# Patient Record
Sex: Male | Born: 1945 | Race: Black or African American | Hispanic: No | Marital: Married | Smoking: Former smoker
Health system: Southern US, Community
[De-identification: ages and names within clinical notes are randomized; demographics above are authoritative.]

## PROBLEM LIST (undated history)

## (undated) DIAGNOSIS — C61 Malignant neoplasm of prostate: Secondary | ICD-10-CM

## (undated) DIAGNOSIS — I10 Essential (primary) hypertension: Secondary | ICD-10-CM

## (undated) DIAGNOSIS — K219 Gastro-esophageal reflux disease without esophagitis: Secondary | ICD-10-CM

## (undated) DIAGNOSIS — E119 Type 2 diabetes mellitus without complications: Secondary | ICD-10-CM

## (undated) DIAGNOSIS — IMO0001 Reserved for inherently not codable concepts without codable children: Secondary | ICD-10-CM

## (undated) DIAGNOSIS — M1990 Unspecified osteoarthritis, unspecified site: Secondary | ICD-10-CM

## (undated) HISTORY — PX: TRANSURETHRAL RESECTION OF PROSTATE: SHX73

---

## 1998-04-13 ENCOUNTER — Encounter: Payer: Self-pay | Admitting: Internal Medicine

## 1998-04-13 ENCOUNTER — Ambulatory Visit (HOSPITAL_COMMUNITY): Admission: RE | Admit: 1998-04-13 | Discharge: 1998-04-13 | Payer: Self-pay | Admitting: Internal Medicine

## 1998-10-01 ENCOUNTER — Encounter: Payer: Self-pay | Admitting: Internal Medicine

## 1998-10-01 ENCOUNTER — Inpatient Hospital Stay (HOSPITAL_COMMUNITY): Admission: EM | Admit: 1998-10-01 | Discharge: 1998-10-02 | Payer: Self-pay | Admitting: *Deleted

## 2001-05-09 ENCOUNTER — Emergency Department (HOSPITAL_COMMUNITY): Admission: EM | Admit: 2001-05-09 | Discharge: 2001-05-09 | Payer: Self-pay | Admitting: Emergency Medicine

## 2001-05-09 ENCOUNTER — Encounter: Payer: Self-pay | Admitting: Emergency Medicine

## 2001-05-11 ENCOUNTER — Encounter: Admission: RE | Admit: 2001-05-11 | Discharge: 2001-05-11 | Payer: Self-pay | Admitting: Internal Medicine

## 2001-05-11 ENCOUNTER — Encounter: Payer: Self-pay | Admitting: Internal Medicine

## 2001-11-10 ENCOUNTER — Encounter: Admission: RE | Admit: 2001-11-10 | Discharge: 2001-11-10 | Payer: Self-pay | Admitting: Internal Medicine

## 2001-11-10 ENCOUNTER — Encounter: Payer: Self-pay | Admitting: Internal Medicine

## 2003-11-07 ENCOUNTER — Ambulatory Visit (HOSPITAL_COMMUNITY): Admission: RE | Admit: 2003-11-07 | Discharge: 2003-11-07 | Payer: Self-pay | Admitting: Urology

## 2003-12-13 ENCOUNTER — Inpatient Hospital Stay (HOSPITAL_COMMUNITY): Admission: RE | Admit: 2003-12-13 | Discharge: 2003-12-16 | Payer: Self-pay | Admitting: Urology

## 2004-02-27 ENCOUNTER — Ambulatory Visit: Admission: RE | Admit: 2004-02-27 | Discharge: 2004-05-26 | Payer: Self-pay | Admitting: Radiation Oncology

## 2004-03-06 ENCOUNTER — Encounter (HOSPITAL_COMMUNITY): Admission: RE | Admit: 2004-03-06 | Discharge: 2004-06-04 | Payer: Self-pay | Admitting: Radiation Oncology

## 2004-06-13 ENCOUNTER — Ambulatory Visit: Admission: RE | Admit: 2004-06-13 | Discharge: 2004-06-13 | Payer: Self-pay | Admitting: Radiation Oncology

## 2004-07-25 ENCOUNTER — Ambulatory Visit: Admission: RE | Admit: 2004-07-25 | Discharge: 2004-07-25 | Payer: Self-pay | Admitting: Radiation Oncology

## 2004-09-05 ENCOUNTER — Emergency Department (HOSPITAL_COMMUNITY): Admission: EM | Admit: 2004-09-05 | Discharge: 2004-09-05 | Payer: Self-pay | Admitting: Emergency Medicine

## 2004-09-25 ENCOUNTER — Ambulatory Visit: Payer: Self-pay | Admitting: Internal Medicine

## 2004-09-26 ENCOUNTER — Ambulatory Visit: Payer: Self-pay | Admitting: Internal Medicine

## 2010-07-24 ENCOUNTER — Encounter: Payer: Self-pay | Admitting: Internal Medicine

## 2013-08-22 ENCOUNTER — Emergency Department (HOSPITAL_COMMUNITY)
Admission: EM | Admit: 2013-08-22 | Discharge: 2013-08-22 | Disposition: A | Discharge status: Home / Self Care | Payer: Non-veteran care | Attending: Emergency Medicine | Admitting: Emergency Medicine

## 2013-08-22 ENCOUNTER — Emergency Department (INDEPENDENT_AMBULATORY_CARE_PROVIDER_SITE_OTHER)
Admission: EM | Admit: 2013-08-22 | Discharge: 2013-08-22 | Disposition: A | Discharge status: Hospice | Payer: Medicare Other | Source: Home / Self Care | Attending: Family Medicine | Admitting: Family Medicine

## 2013-08-22 ENCOUNTER — Encounter (HOSPITAL_COMMUNITY): Payer: Self-pay | Admitting: Emergency Medicine

## 2013-08-22 DIAGNOSIS — E119 Type 2 diabetes mellitus without complications: Secondary | ICD-10-CM

## 2013-08-22 DIAGNOSIS — I10 Essential (primary) hypertension: Secondary | ICD-10-CM | POA: Insufficient documentation

## 2013-08-22 DIAGNOSIS — Z8546 Personal history of malignant neoplasm of prostate: Secondary | ICD-10-CM | POA: Insufficient documentation

## 2013-08-22 DIAGNOSIS — R350 Frequency of micturition: Secondary | ICD-10-CM | POA: Insufficient documentation

## 2013-08-22 DIAGNOSIS — Z79899 Other long term (current) drug therapy: Secondary | ICD-10-CM | POA: Insufficient documentation

## 2013-08-22 DIAGNOSIS — E1165 Type 2 diabetes mellitus with hyperglycemia: Secondary | ICD-10-CM

## 2013-08-22 DIAGNOSIS — Z87891 Personal history of nicotine dependence: Secondary | ICD-10-CM | POA: Insufficient documentation

## 2013-08-22 DIAGNOSIS — I160 Hypertensive urgency: Secondary | ICD-10-CM

## 2013-08-22 DIAGNOSIS — K219 Gastro-esophageal reflux disease without esophagitis: Secondary | ICD-10-CM | POA: Insufficient documentation

## 2013-08-22 HISTORY — DX: Malignant neoplasm of prostate: C61

## 2013-08-22 HISTORY — DX: Essential (primary) hypertension: I10

## 2013-08-22 HISTORY — DX: Reserved for inherently not codable concepts without codable children: IMO0001

## 2013-08-22 HISTORY — DX: Gastro-esophageal reflux disease without esophagitis: K21.9

## 2013-08-22 LAB — COMPREHENSIVE METABOLIC PANEL
ALK PHOS: 158 U/L — AB (ref 39–117)
ALT: 41 U/L (ref 0–53)
ANION GAP: 17 — AB (ref 5–15)
AST: 33 U/L (ref 0–37)
Albumin: 4.4 g/dL (ref 3.5–5.2)
BUN: 22 mg/dL (ref 6–23)
CO2: 25 meq/L (ref 19–32)
Calcium: 9.7 mg/dL (ref 8.4–10.5)
Chloride: 92 mEq/L — ABNORMAL LOW (ref 96–112)
Creatinine, Ser: 1.14 mg/dL (ref 0.50–1.35)
GFR calc Af Amer: 76 mL/min — ABNORMAL LOW (ref >90)
GFR calc non Af Amer: 66 mL/min — ABNORMAL LOW (ref >90)
Glucose, Bld: 515 mg/dL — ABNORMAL HIGH (ref 70–99)
POTASSIUM: 4.4 meq/L (ref 3.7–5.3)
SODIUM: 134 meq/L — AB (ref 137–147)
Total Bilirubin: 0.7 mg/dL (ref 0.3–1.2)
Total Protein: 9 g/dL — ABNORMAL HIGH (ref 6.0–8.3)

## 2013-08-22 LAB — CBC WITH DIFFERENTIAL/PLATELET
BASOS ABS: 0 10*3/uL (ref 0.0–0.1)
BASOS PCT: 0 % (ref 0–1)
EOS ABS: 0.1 10*3/uL (ref 0.0–0.7)
EOS PCT: 1 % (ref 0–5)
HEMATOCRIT: 51.8 % (ref 39.0–52.0)
HEMOGLOBIN: 17.9 g/dL — AB (ref 13.0–17.0)
LYMPHS PCT: 33 % (ref 12–46)
Lymphs Abs: 2.6 10*3/uL (ref 0.7–4.0)
MCH: 27.3 pg (ref 26.0–34.0)
MCHC: 34.6 g/dL (ref 30.0–36.0)
MCV: 79.1 fL (ref 78.0–100.0)
MONO ABS: 0.5 10*3/uL (ref 0.1–1.0)
Monocytes Relative: 6 % (ref 3–12)
NEUTROS ABS: 4.7 10*3/uL (ref 1.7–7.7)
NEUTROS PCT: 60 % (ref 43–77)
Platelets: 222 10*3/uL (ref 150–400)
RBC: 6.55 MIL/uL — ABNORMAL HIGH (ref 4.22–5.81)
RDW: 13.8 % (ref 11.5–15.5)
WBC: 7.8 10*3/uL (ref 4.0–10.5)

## 2013-08-22 LAB — HEMOGLOBIN A1C
HEMOGLOBIN A1C: 11.2 % — AB (ref <5.7)
Mean Plasma Glucose: 275 mg/dL — ABNORMAL HIGH (ref <117)

## 2013-08-22 LAB — POCT I-STAT, CHEM 8
BUN: 24 mg/dL — ABNORMAL HIGH (ref 6–23)
CHLORIDE: 97 meq/L (ref 96–112)
Calcium, Ion: 1.15 mmol/L (ref 1.13–1.30)
Creatinine, Ser: 1.3 mg/dL (ref 0.50–1.35)
GLUCOSE: 602 mg/dL — AB (ref 70–99)
HEMATOCRIT: 58 % — AB (ref 39.0–52.0)
HEMOGLOBIN: 19.7 g/dL — AB (ref 13.0–17.0)
POTASSIUM: 4.3 meq/L (ref 3.7–5.3)
Sodium: 135 mEq/L — ABNORMAL LOW (ref 137–147)
TCO2: 27 mmol/L (ref 0–100)

## 2013-08-22 LAB — POCT URINALYSIS DIP (DEVICE)
BILIRUBIN URINE: NEGATIVE
Glucose, UA: >=1000 mg/dL — AB
KETONES UR: 15 mg/dL — AB
Leukocytes, UA: NEGATIVE
NITRITE: NEGATIVE
PROTEIN: NEGATIVE mg/dL
Specific Gravity, Urine: 1.01 (ref 1.005–1.030)
Urobilinogen, UA: 0.2 mg/dL (ref 0.0–1.0)
pH: 6 (ref 5.0–8.0)

## 2013-08-22 LAB — CBG MONITORING, ED
GLUCOSE-CAPILLARY: 361 mg/dL — AB (ref 70–99)
Glucose-Capillary: 318 mg/dL — ABNORMAL HIGH (ref 70–99)

## 2013-08-22 MED ORDER — SODIUM CHLORIDE 0.9 % IV BOLUS (SEPSIS)
2000.0000 mL | Freq: Once | INTRAVENOUS | Status: AC
  Administered 2013-08-22: 2000 mL via INTRAVENOUS

## 2013-08-22 MED ORDER — INSULIN ASPART 100 UNIT/ML ~~LOC~~ SOLN
10.0000 [IU] | Freq: Once | SUBCUTANEOUS | Status: AC
  Administered 2013-08-22: 10 [IU] via SUBCUTANEOUS
  Filled 2013-08-22: qty 1

## 2013-08-22 MED ORDER — INSULIN ASPART 100 UNIT/ML ~~LOC~~ SOLN
10.0000 [IU] | Freq: Once | SUBCUTANEOUS | Status: AC
  Administered 2013-08-22: 10 [IU] via INTRAVENOUS
  Filled 2013-08-22: qty 1

## 2013-08-22 MED ORDER — METFORMIN HCL 500 MG PO TABS: 500.0000 mg | ORAL_TABLET | Freq: Two times a day (BID) | ORAL | Status: DC

## 2013-08-22 NOTE — ED Notes (Signed)
Pt. Was sent to [place] from [facility] for evaluation for hyperglycemia. Wife stated, he's had urinary frequency, dry mouth, constantly thirsty, and weakness for about a week.  [facility] statedm CBG was 600

## 2013-08-22 NOTE — ED Provider Notes (Signed)
CSN: [id number]     Arrival date & time 08/22/13  1049 History   First MD Initiated Contact with Patient 08/22/13 1105     Chief Complaint  Patient presents with  . Dizziness  . Urinary Frequency   (Consider location/radiation/quality/duration/timing/severity/associated sxs/prior Treatment) HPI  Feeling dizzy, weak, and tired. Started about one week ago. Frequent urination - getting worse. Urinating every hours. Polydypsia. Associated w/ blurry vision. Unintentional wt loss (18lbs over past 1 wk.), LE numbness. Numbness and blurry vision are intermittent. Dry mouth. Nothing makes symptoms better.   HTN: takes HCTZ triamterene for BP. Denies CP, palpitations or SOB.   Past Medical History  Diagnosis Date  . Hypertension   . Reflux   . Prostate cancer    Past Surgical History  Procedure Laterality Date  . Transurethral resection of prostate     No family history on file. History  Substance Use Topics  . Smoking status: Former Smoker    Quit date: 08/23/2003  . Smokeless tobacco: Not on file  . Alcohol Use: Yes     Comment: occ    Review of Systems Per HPI with all other pertinent systems negative.   Allergies  Review of patient's allergies indicates no known allergies.  Home Medications   Prior to Admission medications   Not on File   BP 164/119  Pulse 102  Temp(Src) 97.7 F (36.5 C) (Oral)  Resp 20  Ht 6\' 1"  (1.854 m)  Wt 205 lb (92.987 kg)  BMI 27.05 kg/m2  SpO2 100% Physical Exam  Constitutional: He is oriented to person, place, and time. He appears well-developed and well-nourished. No distress.  HENT:  Head: Normocephalic and atraumatic.  Eyes: EOM are normal. Pupils are equal, round, and reactive to light.  Neck: Normal range of motion.  Cardiovascular: Normal rate, regular rhythm, normal heart sounds and intact distal pulses.   Pulmonary/Chest: Effort normal and breath sounds normal. No respiratory distress.  Abdominal: Soft.  Hypoactive BS   Musculoskeletal: Normal range of motion. He exhibits no edema and no tenderness.  Neurological: He is alert and oriented to person, place, and time. No cranial nerve deficit. He exhibits normal muscle tone.  Skin: Skin is warm. No rash noted. He is not diaphoretic.  Psychiatric: He has a normal mood and affect. His behavior is normal. Judgment and thought content normal.    ED Course  Procedures (including critical care time) Labs Review Labs Reviewed  POCT URINALYSIS DIP (DEVICE) - Abnormal; Notable for the following:    Glucose, UA >=1000 (*)    Ketones, ur 15 (*)    Hgb urine dipstick TRACE (*)    All other components within normal limits  POCT I-STAT, CHEM 8 - Abnormal; Notable for the following:    Sodium 135 (*)    BUN 24 (*)    Glucose, Bld 602 (*)    Hemoglobin 19.7 (*)    HCT 58.0 (*)    All other components within normal limits  CBC WITH DIFFERENTIAL  COMPREHENSIVE METABOLIC PANEL  HEMOGLOBIN A1C    Imaging Review No results found.   MDM   1. Type 2 diabetes mellitus with hyperglycemia   2. Hypertensive urgency    DM: New issue for pt. Hyperosmolar w/o Anion gap. Ketonuria. Polydypsia, polyuria, blurred vision, numbness and burning in legs, fatigue, adn wt loss all likely from DM. Likely type II. Pt needs fluids, insulin and further labs. No PCP so unable to treat now w/ f/u tomorrow. Needs further  mgt and possible admission through [facility].  Pt refusing EMS transport and would prefer to go via shuttle. Pt stable  HTN: hypertension at baseline. Some elevation today likely from stress from illness. Clonidine deferred as going to ED for further mgt via shuttle. May normalize w/ rest fluids, and DM control.   CBCw/diff, CMEt, A1c ordered and pending  Linna Darner, MD [facility] 08/22/2013, 11:52 AM      Waldemar Dickens, MD 08/22/13 [phone]

## 2013-08-22 NOTE — ED Notes (Signed)
CBG 318 

## 2013-08-22 NOTE — Discharge Instructions (Signed)
Blood Glucose Monitoring °Monitoring your blood glucose (also know as blood sugar) helps you to manage your diabetes. It also helps you and your health care provider monitor your diabetes and determine how well your treatment plan is working. °WHY SHOULD YOU MONITOR YOUR BLOOD GLUCOSE? °· It can help you understand how food, exercise, and medicine affect your blood glucose. °· It allows you to know what your blood glucose is at any given moment. You can quickly tell if you are having low blood glucose (hypoglycemia) or high blood glucose (hyperglycemia). °· It can help you and your health care provider know how to adjust your medicines. °· It can help you understand how to manage an illness or adjust medicine for exercise. °WHEN SHOULD YOU TEST? °Your health care provider will help you decide how often you should check your blood glucose. This may depend on the type of diabetes you have, your diabetes control, or the types of medicines you are taking. Be sure to write down all of your blood glucose readings so that this information can be reviewed with your health care provider. See below for examples of testing times that your health care provider may suggest. °Type 1 Diabetes °· Test 4 times a day if you are in good control, using an insulin pump, or perform multiple daily injections. °· If your diabetes is not well controlled or if you are sick, you may need to monitor more often. °· It is a good idea to also monitor: °¨ Before and after exercise. °¨ Between meals and 2 hours after a meal. °¨ Occasionally between 2:00 a.m. and 3:00 a.m. °Type 2 Diabetes °· It can vary with each person, but generally, if you are on insulin, test 4 times a day. °· If you take medicines by mouth (orally), test 2 times a day. °· If you are on a controlled diet, test once a day. °· If your diabetes is not well controlled or if you are sick, you may need to monitor more often. °HOW TO MONITOR YOUR BLOOD GLUCOSE °Supplies  Needed °· Blood glucose meter. °· Test strips for your meter. Each meter has its own strips. You must use the strips that go with your own meter. °· A pricking needle (lancet). °· A device that holds the lancet (lancing device). °· A journal or log book to write down your results. °Procedure °· Wash your hands with soap and water. Alcohol is not preferred. °· Prick the side of your finger (not the tip) with the lancet. °· Gently milk the finger until a small drop of blood appears. °· Follow the instructions that come with your meter for inserting the test strip, applying blood to the strip, and using your blood glucose meter. °Other Areas to Get Blood for Testing °Some meters allow you to use other areas of your body (other than your finger) to test your blood. These areas are called alternative sites. The most common alternative sites are: °· The forearm. °· The thigh. °· The back area of the lower leg. °· The palm of the hand. °The blood flow in these areas is slower. Therefore, the blood glucose values you get may be delayed, and the numbers are different from what you would get from your fingers. Do not use alternative sites if you think you are having hypoglycemia. Your reading will not be accurate. Always use a finger if you are having hypoglycemia. Also, if you cannot feel your lows (hypoglycemia unawareness), always use your fingers for your   blood glucose checks. °ADDITIONAL TIPS FOR GLUCOSE MONITORING °· Do not reuse lancets. °· Always carry your supplies with you. °· All blood glucose meters have a 24-hour "hotline" number to call if you have questions or need help. °· Adjust (calibrate) your blood glucose meter with a control solution after finishing a few boxes of strips. °BLOOD GLUCOSE RECORD KEEPING °It is a good idea to keep a daily record or log of your blood glucose readings. Most glucose meters, if not all, keep your glucose records stored in the meter. Some meters come with the ability to download  your records to your home computer. Keeping a record of your blood glucose readings is especially helpful if you are wanting to look for patterns. Make notes to go along with the blood glucose readings because you might forget what happened at that exact time. Keeping good records helps you and your health care provider to work together to achieve good diabetes management.  °Document Released: 01/16/2003 Document Revised: 05/30/2013 Document Reviewed: 06/07/2012 °ExitCare® Patient Information ©2015 [employer]. This information is not intended to replace advice given to you by your health care provider. Make sure you discuss any questions you have with your health care provider. ° °Diabetes Mellitus and Food °It is important for you to manage your blood sugar (glucose) level. Your blood glucose level can be greatly affected by what you eat. Eating healthier foods in the appropriate amounts throughout the day at about the same time each day will help you control your blood glucose level. It can also help slow or prevent worsening of your diabetes mellitus. Healthy eating may even help you improve the level of your blood pressure and reach or maintain a healthy weight.  °HOW CAN FOOD AFFECT ME? °Carbohydrates °Carbohydrates affect your blood glucose level more than any other type of food. Your dietitian will help you determine how many carbohydrates to eat at each meal and teach you how to count carbohydrates. Counting carbohydrates is important to keep your blood glucose at a healthy level, especially if you are using insulin or taking certain medicines for diabetes mellitus. °Alcohol °Alcohol can cause sudden decreases in blood glucose (hypoglycemia), especially if you use insulin or take certain medicines for diabetes mellitus. Hypoglycemia can be a life-threatening condition. Symptoms of hypoglycemia (sleepiness, dizziness, and disorientation) are similar to symptoms of having too much alcohol.  °If your health  care provider has given you approval to drink alcohol, do so in moderation and use the following guidelines: °· Women should not have more than one drink per day, and men should not have more than two drinks per day. One drink is equal to: °¨ 12 oz of beer. °¨ 5 oz of wine. °¨ 1½ oz of hard liquor. °· Do not drink on an empty stomach. °· Keep yourself hydrated. Have water, diet soda, or unsweetened iced tea. °· Regular soda, juice, and other mixers might contain a lot of carbohydrates and should be counted. °WHAT FOODS ARE NOT RECOMMENDED? °As you make food choices, it is important to remember that all foods are not the same. Some foods have fewer nutrients per serving than other foods, even though they might have the same number of calories or carbohydrates. It is difficult to get your body what it needs when you eat foods with fewer nutrients. Examples of foods that you should avoid that are high in calories and carbohydrates but low in nutrients include: °· Trans fats (most processed foods list trans fats on the   Nutrition Facts label).  Regular soda.  Juice.  Candy.  Sweets, such as cake, pie, doughnuts, and cookies.  Fried foods. WHAT FOODS CAN I EAT? Have nutrient-rich foods, which will nourish your body and keep you healthy. The food you should eat also will depend on several factors, including:  The calories you need.  The medicines you take.  Your weight.  Your blood glucose level.  Your blood pressure level.  Your cholesterol level. You also should eat a variety of foods, including:  Protein, such as meat, poultry, fish, tofu, nuts, and seeds (lean animal proteins are best).  Fruits.  Vegetables.  Dairy products, such as milk, cheese, and yogurt (low fat is best).  Breads, grains, pasta, cereal, rice, and beans.  Fats such as olive oil, trans fat-free margarine, canola oil, avocado, and olives. DOES EVERYONE WITH DIABETES MELLITUS HAVE THE SAME MEAL PLAN? Because every  person with diabetes mellitus is different, there is not one meal plan that works for everyone. It is very important that you meet with a dietitian who will help you create a meal plan that is just right for you. Document Released: 10/10/2004 Document Revised: 01/18/2013 Document Reviewed: 12/10/2012 [facility] Patient Information 2015 [place], [place]. This information is not intended to replace advice given to you by your health care provider. Make sure you discuss any questions you have with your health care provider.  Hyperglycemia Hyperglycemia occurs when the glucose (sugar) in your blood is too high. Hyperglycemia can happen for many reasons, but it most often happens to people who do not know they have diabetes or are not managing their diabetes properly.  CAUSES  Whether you have diabetes or not, there are other causes of hyperglycemia. Hyperglycemia can occur when you have diabetes, but it can also occur in other situations that you might not be as aware of, such as: Diabetes  If you have diabetes and are having problems controlling your blood glucose, hyperglycemia could occur because of some of the following reasons:  Not following your meal plan.  Not taking your diabetes medications or not taking it properly.  Exercising less or doing less activity than you normally do.  Being sick. Pre-diabetes  This cannot be ignored. Before people develop Type 2 diabetes, they almost always have "pre-diabetes." This is when your blood glucose levels are higher than normal, but not yet high enough to be diagnosed as diabetes. Research has shown that some long-term damage to the body, especially the heart and circulatory system, may already be occurring during pre-diabetes. If you take action to manage your blood glucose when you have pre-diabetes, you may delay or prevent Type 2 diabetes from developing. Stress  If you have diabetes, you may be "diet" controlled or on oral medications or insulin  to control your diabetes. However, you may find that your blood glucose is higher than usual in the hospital whether you have diabetes or not. This is often referred to as "stress hyperglycemia." Stress can elevate your blood glucose. This happens because of hormones put out by the body during times of stress. If stress has been the cause of your high blood glucose, it can be followed regularly by your caregiver. That way he/she can make sure your hyperglycemia does not continue to get worse or progress to diabetes. Steroids  Steroids are medications that act on the infection fighting system (immune system) to block inflammation or infection. One side effect can be a rise in blood glucose. Most people can produce enough  extra insulin to allow for this rise, but for those who cannot, steroids make blood glucose levels go even higher. It is not unusual for steroid treatments to "uncover" diabetes that is developing. It is not always possible to determine if the hyperglycemia will go away after the steroids are stopped. A special blood test called an A1c is sometimes done to determine if your blood glucose was elevated before the steroids were started. SYMPTOMS  Thirsty.  Frequent urination.  Dry mouth.  Blurred vision.  Tired or fatigue.  Weakness.  Sleepy.  Tingling in feet or leg. DIAGNOSIS  Diagnosis is made by monitoring blood glucose in one or all of the following ways:  A1c test. This is a chemical found in your blood.  Fingerstick blood glucose monitoring.  Laboratory results. TREATMENT  First, knowing the cause of the hyperglycemia is important before the hyperglycemia can be treated. Treatment may include, but is not be limited to:  Education.  Change or adjustment in medications.  Change or adjustment in meal plan.  Treatment for an illness, infection, etc.  More frequent blood glucose monitoring.  Change in exercise plan.  Decreasing or stopping  steroids.  Lifestyle changes. HOME CARE INSTRUCTIONS   Test your blood glucose as directed.  Exercise regularly. Your caregiver will give you instructions about exercise. Pre-diabetes or diabetes which comes on with stress is helped by exercising.  Eat wholesome, balanced meals. Eat often and at regular, fixed times. Your caregiver or nutritionist will give you a meal plan to guide your sugar intake.  Being at an ideal weight is important. If needed, losing as little as 10 to 15 pounds may help improve blood glucose levels. SEEK MEDICAL CARE IF:   You have questions about medicine, activity, or diet.  You continue to have symptoms (problems such as increased thirst, urination, or weight gain). SEEK IMMEDIATE MEDICAL CARE IF:   You are vomiting or have diarrhea.  Your breath smells fruity.  You are breathing faster or slower.  You are very sleepy or incoherent.  You have numbness, tingling, or pain in your feet or hands.  You have chest pain.  Your symptoms get worse even though you have been following your caregiver's orders.  If you have any other questions or concerns. Document Released: 07/09/2000 Document Revised: 04/07/2011 Document Reviewed: 05/12/2011 [facility] Patient Information 2015 [place], [place]. This information is not intended to replace advice given to you by your health care provider. Make sure you discuss any questions you have with your health care provider.   Emergency Department Resource Guide 1) Find a Doctor and Pay Out of Pocket Although you won't have to find out who is covered by your insurance plan, it is a good idea to ask around and get recommendations. You will then need to call the office and see if the doctor you have chosen will accept you as a new patient and what types of options they offer for patients who are self-pay. Some doctors offer discounts or will set up payment plans for their patients who do not have insurance, but you will need to  ask so you aren't surprised when you get to your appointment.  2) Contact Your Local Health Department Not all health departments have doctors that can see patients for sick visits, but many do, so it is worth a call to see if yours does. If you don't know where your local health department is, you can check in your phone book. The CDC also has a  tool to help you locate your state's health department, and many state websites also have listings of all of their local health departments.  3) Find a [facility] If your illness is not likely to be very severe or complicated, you may want to try a walk in clinic. These are popping up all over the country in pharmacies, drugstores, and shopping centers. They're usually staffed by nurse practitioners or physician assistants that have been trained to treat common illnesses and complaints. They're usually fairly quick and inexpensive. However, if you have serious medical issues or chronic medical problems, these are probably not your best option.  No Primary Care Doctor: - Call Health Connect at  [phone] - they can help you locate a primary care doctor that  accepts your insurance, provides certain services, etc. - Physician Referral Service- [phone]  Chronic Pain Problems: Organization         Address  Phone   Notes  [facility]  [phone] Patients need to be referred by their primary care doctor.   Medication Assistance: Organization         Address  Phone   Notes  [facility] Medication [facility] [address] [place], [place] [address] [phone] --Must be a resident of [facility] -- Must have NO insurance coverage whatsoever (no Medicaid/ Medicare, etc.) -- The pt. MUST have a primary care doctor that directs their care regularly and follows them in the community   MedAssist  [phone]   [employer]  [phone]    Agencies that provide inexpensive medical  care: Organization         Address  Phone   Notes  [place]  [phone]   [facility]    [phone]   [facility], [place] [address] [phone]   [place] [address], [place] [phone]   Planned Parenthood    [phone]   [facility]    [phone]   [place] [address], [place] Phone:  [phone], Fax:  [phone] Hours of Operation:  9 am - 6 pm, M-F.  Also accepts Medicaid/Medicare and self-pay.  [facility] [place], [address], [place] Phone: [phone], Fax: [phone]. Hours of Operation:  8:30 am - 5:30 pm, M-F.  Also accepts Medicaid and self-pay.  [facility] [place] [address] [place] Phone: [phone]   [place], [place], [place] [phone], Ext. [phone] Mondays & Thursdays: 7-9 AM.  First 15 patients are seen on a first come, first serve basis.    [place] Providers:  Organization         Address  Phone   Notes  [facility] [address], [place] [phone] Also accepts self-pay patients.  [facility] [address], [place]  [phone]   [place], [address], [place] [phone]   [facility] [address] [place] [phone]   Lucianne Lei [address], [place]   [phone] Only accepts [place] Access [place] patients after they have their name applied to their card.   Self-Pay (no insurance) in [place]:  Organization         Address  Phone   Notes  Sickle Cell Patients, [facility] [place] [phone]   [facility] [place] [phone]   [facility] [place]  [place], [place] [phone]   [facility]/Dr. Osei-Bonsu  [address], [place] or [address], [place] [phone] Phone number for both [place] and [place] locations is the same.  Urgent Medical and [facility] [address] [place] [phone]   [facility] [address] [place] or [address] [phone]   [facility] [address], [place] [phone], phone; [phone], fax Sees patients 1st and 3rd Saturday of every month.  Must not qualify for public or private insurance (i.e. Medicaid, Medicare, [place] Health Choice, Veterans' Benefits)  Household income should be no more than 200% of the poverty level The clinic cannot treat you if you are pregnant or think you are pregnant  Sexually transmitted diseases are not treated at the clinic.    Dental Care: Organization         Address  Phone  Notes  [facility] Department of [facility] [place] [phone] Accepts children up to age 1 who are enrolled in [place] or [place]; pregnant women with a Medicaid card; and children who have applied for Medicaid or [place] Health Choice, but were declined, whose parents can pay a reduced fee at time of service.  [facility] Department of [facility]  [address], [place] [phone] Accepts children up to age 48 who are enrolled in [place] or [place]; pregnant women with a Medicaid card; and children who have applied for Medicaid or [place] Health Choice, but were declined, whose parents can pay a reduced fee at time of service.  [facility]  [place] [phone] Patients are seen by appointment only. Walk-ins are not accepted. [place] will see patients 75 years of age and older. Monday - Tuesday (8am-5pm) Most Wednesdays (8:30-5pm) $30 per visit, cash only  [facility] Adult Dental Access PROGRAM  [address], [facility] [phone] Patients are seen by appointment only. Walk-ins are not accepted. [place] will see patients 11 years of age and older. One Wednesday Evening (Monthly: Volunteer Based).  $30 per visit, cash only  [place]  [phone] for adults; Children under age 20, call Graduate Pediatric Dentistry at [phone]. Children aged 52-14, please call [phone] to request a pediatric application.  Dental services are provided in all areas of dental care including fillings, crowns and bridges, complete and partial dentures, implants, gum treatment, root canals, and extractions. Preventive care is also provided. Treatment is provided to both adults and children. Patients are selected via a lottery and there is often a waiting list.   [facility] [address], [place]  [phone] [email or web address]   [facility] [address] [place], [place] [phone], Ext. [phone] Second and Fourth Thursday of each month, opens at 6:30 AM; Clinic ends at 9 AM.  Patients are seen on a first-come first-served basis, and a limited number are seen during each clinic.  [place]  [address] Hillard Danker [place], [place] [phone]   Eligibility Requirements You must have lived in [place], [place], or [place] counties for at least the last three months.   You cannot be eligible for state or federal sponsored [employer], including [employer], [place], or [employer].   You generally cannot be eligible for healthcare insurance through your employer.    How to apply: Eligibility screenings are held every Tuesday and Wednesday afternoon from 1:00 pm until 4:00 pm. You do not need an appointment for the interview!   [facility] [address] [place], [place]   [place]  [place] Department  [place]  [phone]    Behavioral Health Resources in the Community: Intensive Outpatient Programs Organization         Address  Phone  Notes  [employer]. [address] [place], [place] [phone]   [facility] [address] [place], [place]   ADS: Alcohol & Drug Svcs [address], [place]   [place] [address]  [place], [place] or [phone]   Substance Abuse Resources Organization         Address  Phone  Notes  Alcohol and Drug Services  [phone]   [place]  [phone]   [facility] [place]   Chinita Pester  [phone]   Residential & Outpatient Substance Abuse Program  [phone]   Psychological Services Organization         Address  Phone  Notes  [facility] [place]  [place]  [phone]   [place] [address], [place] or [phone]    Mobile Crisis Teams Organization         Address  Phone  Notes  [facility], Mobile Crisis Care Unit  [phone]   [facility]  [address] [place], [place]   [place] [address] [place] [phone]    Self-Help/Support Groups Organization         Address  Phone             Notes  [place] of [place] - variety of support groups  [place] Call for more information  Narcotics Anonymous (NA), [facility] [address], [employer] [place]  2 meetings at this location   Special educational needs teacher         Address  Phone  Notes  [facility]idential [facility] [place],    [place]  [phone]   [facility]  [address], [place] [phone], [place], [place]   [place], [place] [phone] Admissions: 8am-3pm M-F  Incentives Substance [place] [address]    [place], [place] [phone]   [facility] [address] Jadene Pierini [place], [place]   [facility] [address]  [place], [place]   [facility] - Intensive Outpatient [address] Kristeen [address], [place], [place]   [facility] ([place].,  [place], [place] or [phone]   [facility] ([facility]) [address] [place], [place] Accepts Medicaid  [facility] [place] [address],  [place] [phone] Substance Abuse/Addiction  [place] Resources Organization         Address  Phone  Notes  [facility]  [phone]   Domenic Schwab, PhD [address] Arlis Porta [place], [place]   [phone] or [phone]   [place], [place] [phone]   [address], [place], [place] [phone] Insurance/Medicaid/sponsorship through [facility] and Families [address] [address]                                    [place], [place] [phone] [place] [address], [place] [phone]    Dr. Adele Schilder  [phone]   [facility] Dept. 1) [address] [place] 2) [place] 3)  [place] [address], [place] [phone]  [phone]   [place] [phone] or [phone] (After Hours)

## 2013-08-22 NOTE — Progress Notes (Signed)
Pt left UCC prior to having labs drawn and EKG performed. Pt at [facility].

## 2013-08-22 NOTE — ED Provider Notes (Signed)
CSN: [id number]     Arrival date & time 08/22/13  1156 History   First MD Initiated Contact with Patient 08/22/13 1311     Chief Complaint  Patient presents with  . Hyperglycemia  . Urinary Frequency  . Weakness     (Consider location/radiation/quality/duration/timing/severity/associated sxs/prior Treatment) HPI Comments: Patient referred to the ER from urgent care for evaluation of weakness, dizziness, increased thirst, increased urination, weight loss and blurred vision. Patient was found to be hyperglycemic at urgent care. He does not have a history of diabetes. Denies chest pain, shortness of breath. No abdominal pain, nausea, vomiting or diarrhea.  Patient does report numbness and tingling in the right leg. This is associated with pain. It has been present intermittently, and is not present currently.  Patient is a 68 y.o. male presenting with hyperglycemia, frequency, and weakness.  Hyperglycemia Associated symptoms: increased thirst and polyuria   Urinary Frequency  Weakness    Past Medical History  Diagnosis Date  . Hypertension   . Reflux   . Prostate cancer    Past Surgical History  Procedure Laterality Date  . Transurethral resection of prostate     No family history on file. History  Substance Use Topics  . Smoking status: Former Smoker    Quit date: 08/23/2003  . Smokeless tobacco: Not on file  . Alcohol Use: Yes     Comment: occ    Review of Systems  Constitutional: Positive for unexpected weight change.  Endocrine: Positive for polydipsia, polyphagia and polyuria.  Genitourinary: Positive for frequency.  Neurological: Positive for weakness.  All other systems reviewed and are negative.     Allergies  Review of patient's allergies indicates no known allergies.  Home Medications   Prior to Admission medications   Medication Sig Start Date End Date Taking? Authorizing Provider  Ascorbic Acid (VITAMIN C PO) Take 1 tablet by mouth daily.    Yes  Historical Provider, MD  Multiple Vitamin (MULTIVITAMIN WITH MINERALS) TABS tablet Take 1 tablet by mouth daily.   Yes Historical Provider, MD  omega-3 acid ethyl esters (LOVAZA) 1 G capsule Take 1 g by mouth daily.   Yes Historical Provider, MD  omeprazole (PRILOSEC) 20 MG capsule Take 20 mg by mouth daily.   Yes Historical Provider, MD  triamterene-hydrochlorothiazide (MAXZIDE) 75-50 MG per tablet Take 0.5 tablets by mouth daily.   Yes Historical Provider, MD   BP 138/84  Pulse 74  Temp(Src) 97.7 F (36.5 C) (Oral)  Resp 14  Ht 6\' 1"  (1.854 m)  Wt 205 lb (92.987 kg)  BMI 27.05 kg/m2  SpO2 100% Physical Exam  Constitutional: He is oriented to person, place, and time. He appears well-developed and well-nourished. No distress.  HENT:  Head: Normocephalic and atraumatic.  Right Ear: Hearing normal.  Left Ear: Hearing normal.  Nose: Nose normal.  Mouth/Throat: Oropharynx is clear and moist and mucous membranes are normal.  Eyes: Conjunctivae and EOM are normal. Pupils are equal, round, and reactive to light.  Neck: Normal range of motion. Neck supple.  Cardiovascular: Regular rhythm, S1 normal and S2 normal.  Exam reveals no gallop and no friction rub.   No murmur heard. Pulmonary/Chest: Effort normal and breath sounds normal. No respiratory distress. He exhibits no tenderness.  Abdominal: Soft. Normal appearance and bowel sounds are normal. There is no hepatosplenomegaly. There is no tenderness. There is no rebound, no guarding, no tenderness at McBurney's point and negative Murphy's sign. No hernia.  Musculoskeletal: Normal range of motion.  Neurological: He is alert and oriented to person, place, and time. He has normal strength. No cranial nerve deficit or sensory deficit. Coordination normal. GCS eye subscore is 4. GCS verbal subscore is 5. GCS motor subscore is 6.  Skin: Skin is warm, dry and intact. No rash noted. No cyanosis.  Psychiatric: He has a normal mood and affect. His  speech is normal and behavior is normal. Thought content normal.    ED Course  Procedures (including critical care time) Labs Review Labs Reviewed  CBC WITH DIFFERENTIAL - Abnormal; Notable for the following:    RBC 6.55 (*)    Hemoglobin 17.9 (*)    All other components within normal limits  COMPREHENSIVE METABOLIC PANEL - Abnormal; Notable for the following:    Sodium 134 (*)    Chloride 92 (*)    Glucose, Bld 515 (*)    Total Protein 9.0 (*)    Alkaline Phosphatase 158 (*)    GFR calc non Af Amer 66 (*)    GFR calc Af Amer 76 (*)    Anion gap 17 (*)    All other components within normal limits  CBG MONITORING, ED - Abnormal; Notable for the following:    Glucose-Capillary 361 (*)    All other components within normal limits  CBG MONITORING, ED - Abnormal; Notable for the following:    Glucose-Capillary 318 (*)    All other components within normal limits  HEMOGLOBIN A1C  KETONES, QUALITATIVE    Imaging Review No results found.   EKG Interpretation   Date/Time:  Monday August 22 2013 13:45:49 EDT Ventricular Rate:  95 PR Interval:  186 QRS Duration: 91 QT Interval:  398 QTC Calculation: 500 R Axis:   -121 Text Interpretation:  Sinus rhythm Consider left atrial enlargement  Markedly posterior QRS axis Consider right ventricular hypertrophy  Prolonged QT interval No significant change since last tracing Confirmed  by POLLINA  MD, [place] [phone]) on 08/22/2013 4:10:18 PM      MDM   Final diagnoses:  None   new-onset diabetes  Presents for new onset diabetes. Patient exhibiting hyperglycemia without evidence of DKA. Patient aggressively hydrated and given insulin, significant improvement seen here in the ER. Remainder of workup is unremarkable. Patient will be initiated on metformin, followup with primary doctor at the [facility] as soon as possible.  She did mention that he has intermittent numbness in the right leg, and this is generally associated with pain in the  upper leg when it occurs. This does not resemble TIA or CVA. Symptoms are not present today, no further workup necessary currently.    Orpah Greek, MD 08/22/13 [phone]

## 2013-08-22 NOTE — ED Notes (Signed)
C/o dizziness for a week now States he get dizzy after being hot or after activities

## 2017-12-14 HISTORY — PX: OTHER SURGICAL HISTORY: SHX169

## 2017-12-18 ENCOUNTER — Inpatient Hospital Stay (HOSPITAL_COMMUNITY): Payer: No Typology Code available for payment source

## 2017-12-18 ENCOUNTER — Emergency Department (HOSPITAL_COMMUNITY): Payer: No Typology Code available for payment source

## 2017-12-18 ENCOUNTER — Other Ambulatory Visit: Payer: Self-pay

## 2017-12-18 ENCOUNTER — Inpatient Hospital Stay (HOSPITAL_COMMUNITY)
Admission: EM | Admit: 2017-12-18 | Discharge: 2017-12-26 | DRG: 390 | Disposition: A | Discharge status: Home Health | Payer: No Typology Code available for payment source | Attending: General Surgery | Admitting: General Surgery

## 2017-12-18 ENCOUNTER — Encounter (HOSPITAL_COMMUNITY): Payer: Self-pay | Admitting: Emergency Medicine

## 2017-12-18 DIAGNOSIS — K56609 Unspecified intestinal obstruction, unspecified as to partial versus complete obstruction: Principal | ICD-10-CM | POA: Diagnosis present

## 2017-12-18 DIAGNOSIS — Z87891 Personal history of nicotine dependence: Secondary | ICD-10-CM | POA: Diagnosis not present

## 2017-12-18 DIAGNOSIS — K219 Gastro-esophageal reflux disease without esophagitis: Secondary | ICD-10-CM | POA: Diagnosis present

## 2017-12-18 DIAGNOSIS — Z7984 Long term (current) use of oral hypoglycemic drugs: Secondary | ICD-10-CM | POA: Diagnosis not present

## 2017-12-18 DIAGNOSIS — Z79899 Other long term (current) drug therapy: Secondary | ICD-10-CM

## 2017-12-18 DIAGNOSIS — Z96641 Presence of right artificial hip joint: Secondary | ICD-10-CM | POA: Diagnosis present

## 2017-12-18 DIAGNOSIS — I10 Essential (primary) hypertension: Secondary | ICD-10-CM | POA: Diagnosis present

## 2017-12-18 DIAGNOSIS — Z8546 Personal history of malignant neoplasm of prostate: Secondary | ICD-10-CM

## 2017-12-18 DIAGNOSIS — E876 Hypokalemia: Secondary | ICD-10-CM | POA: Diagnosis present

## 2017-12-18 DIAGNOSIS — Z0189 Encounter for other specified special examinations: Secondary | ICD-10-CM

## 2017-12-18 DIAGNOSIS — R109 Unspecified abdominal pain: Secondary | ICD-10-CM

## 2017-12-18 DIAGNOSIS — E119 Type 2 diabetes mellitus without complications: Secondary | ICD-10-CM | POA: Diagnosis present

## 2017-12-18 HISTORY — DX: Type 2 diabetes mellitus without complications: E11.9

## 2017-12-18 HISTORY — DX: Unspecified osteoarthritis, unspecified site: M19.90

## 2017-12-18 LAB — URINALYSIS, ROUTINE W REFLEX MICROSCOPIC
Bilirubin Urine: NEGATIVE
GLUCOSE, UA: NEGATIVE mg/dL
Ketones, ur: 5 mg/dL — AB
Leukocytes, UA: NEGATIVE
NITRITE: NEGATIVE
PROTEIN: 100 mg/dL — AB
Specific Gravity, Urine: 1.029 (ref 1.005–1.030)
pH: 5 (ref 5.0–8.0)

## 2017-12-18 LAB — CBG MONITORING, ED
GLUCOSE-CAPILLARY: 154 mg/dL — AB (ref 70–99)
GLUCOSE-CAPILLARY: 106 mg/dL — AB (ref 70–99)

## 2017-12-18 LAB — COMPREHENSIVE METABOLIC PANEL
ALT: 30 U/L (ref 0–44)
ANION GAP: 11 (ref 5–15)
AST: 40 U/L (ref 15–41)
Albumin: 3.5 g/dL (ref 3.5–5.0)
Alkaline Phosphatase: 78 U/L (ref 38–126)
BUN: 24 mg/dL — ABNORMAL HIGH (ref 8–23)
CHLORIDE: 99 mmol/L (ref 98–111)
CO2: 29 mmol/L (ref 22–32)
Calcium: 8.9 mg/dL (ref 8.9–10.3)
Creatinine, Ser: 1.17 mg/dL (ref 0.61–1.24)
Glucose, Bld: 178 mg/dL — ABNORMAL HIGH (ref 70–99)
POTASSIUM: 3.7 mmol/L (ref 3.5–5.1)
SODIUM: 139 mmol/L (ref 135–145)
Total Bilirubin: 1.2 mg/dL (ref 0.3–1.2)
Total Protein: 7.5 g/dL (ref 6.5–8.1)

## 2017-12-18 LAB — CBC
HCT: 38.6 % — ABNORMAL LOW (ref 39.0–52.0)
HEMATOCRIT: 39.4 % (ref 39.0–52.0)
HEMOGLOBIN: 12.8 g/dL — AB (ref 13.0–17.0)
Hemoglobin: 12.1 g/dL — ABNORMAL LOW (ref 13.0–17.0)
MCH: 26.8 pg (ref 26.0–34.0)
MCH: 26.1 pg (ref 26.0–34.0)
MCHC: 32.5 g/dL (ref 30.0–36.0)
MCHC: 31.3 g/dL (ref 30.0–36.0)
MCV: 82.4 fL (ref 80.0–100.0)
MCV: 83.2 fL (ref 80.0–100.0)
NRBC: 0 % (ref 0.0–0.2)
PLATELETS: 260 10*3/uL (ref 150–400)
Platelets: 270 10*3/uL (ref 150–400)
RBC: 4.78 MIL/uL (ref 4.22–5.81)
RBC: 4.64 MIL/uL (ref 4.22–5.81)
RDW: 14.1 % (ref 11.5–15.5)
RDW: 14.4 % (ref 11.5–15.5)
WBC: 5.5 10*3/uL (ref 4.0–10.5)
WBC: 4.8 10*3/uL (ref 4.0–10.5)
nRBC: 0 % (ref 0.0–0.2)

## 2017-12-18 LAB — I-STAT CG4 LACTIC ACID, ED
LACTIC ACID, VENOUS: 2.04 mmol/L — AB (ref 0.5–1.9)
Lactic Acid, Venous: 1.91 mmol/L — ABNORMAL HIGH (ref 0.5–1.9)

## 2017-12-18 LAB — GLUCOSE, CAPILLARY: Glucose-Capillary: 102 mg/dL — ABNORMAL HIGH (ref 70–99)

## 2017-12-18 LAB — CREATININE, SERUM
CREATININE: 0.98 mg/dL (ref 0.61–1.24)
GFR calc Af Amer: >60 mL/min (ref >60)

## 2017-12-18 LAB — LIPASE, BLOOD: LIPASE: 23 U/L (ref 11–51)

## 2017-12-18 MED ORDER — LIDOCAINE VISCOUS HCL 2 % MT SOLN: 15.0000 mL | Freq: Once | OROMUCOSAL | Status: DC

## 2017-12-18 MED ORDER — ENOXAPARIN SODIUM 40 MG/0.4ML ~~LOC~~ SOLN
40.0000 mg | SUBCUTANEOUS | Status: DC
  Administered 2017-12-18 – 2017-12-25 (×8): 40 mg via SUBCUTANEOUS
  Filled 2017-12-18 (×8): qty 0.4

## 2017-12-18 MED ORDER — ONDANSETRON HCL 4 MG/2ML IJ SOLN
4.0000 mg | Freq: Four times a day (QID) | INTRAMUSCULAR | Status: DC | PRN
  Administered 2017-12-18 – 2017-12-23 (×2): 4 mg via INTRAVENOUS
  Filled 2017-12-18 (×2): qty 2

## 2017-12-18 MED ORDER — SODIUM CHLORIDE (PF) 0.9 % IJ SOLN
INTRAMUSCULAR | Status: AC
  Filled 2017-12-18: qty 50

## 2017-12-18 MED ORDER — POTASSIUM CHLORIDE IN NACL 20-0.9 MEQ/L-% IV SOLN
INTRAVENOUS | Status: DC
  Administered 2017-12-19 (×3): via INTRAVENOUS
  Filled 2017-12-18 (×5): qty 1000

## 2017-12-18 MED ORDER — SODIUM CHLORIDE 0.9 % IV BOLUS
1000.0000 mL | Freq: Once | INTRAVENOUS | Status: AC
  Administered 2017-12-18: 1000 mL via INTRAVENOUS

## 2017-12-18 MED ORDER — DIPHENHYDRAMINE HCL 50 MG/ML IJ SOLN: 12.5000 mg | Freq: Four times a day (QID) | INTRAMUSCULAR | Status: DC | PRN

## 2017-12-18 MED ORDER — INSULIN ASPART 100 UNIT/ML ~~LOC~~ SOLN
0.0000 [IU] | SUBCUTANEOUS | Status: DC
  Administered 2017-12-19 – 2017-12-22 (×8): 2 [IU] via SUBCUTANEOUS
  Administered 2017-12-23: 1 [IU] via SUBCUTANEOUS
  Administered 2017-12-23: 2 [IU] via SUBCUTANEOUS
  Administered 2017-12-23 (×2): 3 [IU] via SUBCUTANEOUS

## 2017-12-18 MED ORDER — IOPAMIDOL (ISOVUE-300) INJECTION 61%
INTRAVENOUS | Status: AC
  Filled 2017-12-18: qty 100

## 2017-12-18 MED ORDER — ACETAMINOPHEN 325 MG PO TABS
650.0000 mg | ORAL_TABLET | Freq: Four times a day (QID) | ORAL | Status: DC | PRN
  Administered 2017-12-24: 650 mg via ORAL
  Filled 2017-12-18 (×2): qty 2

## 2017-12-18 MED ORDER — FAMOTIDINE IN NACL 20-0.9 MG/50ML-% IV SOLN
20.0000 mg | INTRAVENOUS | Status: DC
  Administered 2017-12-19 – 2017-12-24 (×6): 20 mg via INTRAVENOUS
  Filled 2017-12-18 (×8): qty 50

## 2017-12-18 MED ORDER — ONDANSETRON 4 MG PO TBDP: 4.0000 mg | ORAL_TABLET | Freq: Four times a day (QID) | ORAL | Status: DC | PRN

## 2017-12-18 MED ORDER — ONDANSETRON HCL 4 MG/2ML IJ SOLN
4.0000 mg | Freq: Once | INTRAMUSCULAR | Status: AC
  Administered 2017-12-18: 4 mg via INTRAVENOUS
  Filled 2017-12-18: qty 2

## 2017-12-18 MED ORDER — DIATRIZOATE MEGLUMINE & SODIUM 66-10 % PO SOLN
90.0000 mL | Freq: Once | ORAL | Status: AC
  Administered 2017-12-18: 90 mL via NASOGASTRIC
  Filled 2017-12-18: qty 90

## 2017-12-18 MED ORDER — SODIUM CHLORIDE 0.9 % IV SOLN
Freq: Once | INTRAVENOUS | Status: AC
  Administered 2017-12-18: 19:00:00 via INTRAVENOUS

## 2017-12-18 MED ORDER — LIDOCAINE HCL URETHRAL/MUCOSAL 2 % EX GEL
1.0000 "application " | Freq: Once | CUTANEOUS | Status: AC
  Administered 2017-12-18: 1 via INTRATRACHEAL
  Filled 2017-12-18: qty 5

## 2017-12-18 MED ORDER — DIPHENHYDRAMINE HCL 12.5 MG/5ML PO ELIX: 12.5000 mg | ORAL_SOLUTION | Freq: Four times a day (QID) | ORAL | Status: DC | PRN

## 2017-12-18 MED ORDER — MORPHINE SULFATE (PF) 2 MG/ML IV SOLN
2.0000 mg | INTRAVENOUS | Status: DC | PRN
  Administered 2017-12-19 – 2017-12-25 (×6): 2 mg via INTRAVENOUS
  Filled 2017-12-18 (×7): qty 1

## 2017-12-18 MED ORDER — HYDRALAZINE HCL 20 MG/ML IJ SOLN: 10.0000 mg | INTRAMUSCULAR | Status: DC | PRN

## 2017-12-18 MED ORDER — ACETAMINOPHEN 650 MG RE SUPP: 650.0000 mg | Freq: Four times a day (QID) | RECTAL | Status: DC | PRN

## 2017-12-18 MED ORDER — IOPAMIDOL (ISOVUE-300) INJECTION 61%
100.0000 mL | Freq: Once | INTRAVENOUS | Status: AC | PRN
  Administered 2017-12-18: 100 mL via INTRAVENOUS

## 2017-12-18 NOTE — ED Notes (Signed)
ED TO INPATIENT HANDOFF REPORT  Name/Age/Gender Richard Whitaker 72 y.o. male  Code Status    Code Status Orders  (From admission, onward)         Start     Ordered   12/18/17 1315  Full code  Continuous     12/18/17 1319        Code Status History    This patient has a current code status but no historical code status.      Home/SNF/Other Given to floor  Chief Complaint Small bowel obstruction ([place]) [K56.609] Encounter for imaging study to confirm nasogastric (NG) tube placement [Z01.89] Intestinal obstruction, unspecified cause, unspecified whether partial or complete ([place]) [K56.609]  Level of Care/Admitting Diagnosis ED Disposition    ED Disposition Condition [place]: [place] [[id number]]  Level of Care: Med-Surg [16]  Diagnosis: SBO (small bowel obstruction) [facility]) [[id number]]  Admitting Physician: [place], [place]  Attending Physician: CCS, MD [[id number]]  Estimated length of stay: past midnight tomorrow  Certification:: I certify this patient will need inpatient services for at least 2 midnights  Bed request comments: 5W  PT Class (Do Not Modify): Inpatient [101]  PT Acc Code (Do Not Modify): Private [1]       Medical History Past Medical History:  Diagnosis Date  . Arthritis   . Hypertension   . Prostate cancer ([place])   . Reflux     Allergies No Known Allergies  IV Location/Drains/Wounds Patient Lines/Drains/Airways Status   Active Line/Drains/Airways    Name:   Placement date:   Placement time:   Site:   Days:   Peripheral IV 12/18/17 Right Antecubital   12/18/17    0936    Antecubital   less than 1   NG/OG Tube Nasogastric 14 Fr. Left nare Aucultation Documented cm marking at nare/ corner of mouth   12/18/17    1429    Left nare   less than 1          Labs/Imaging Results for orders placed or performed during the hospital encounter of 12/18/17 (from the past 48 hour(s))  Lipase, blood     Status: None    Collection Time: 12/18/17  9:37 AM  Result Value Ref Range   Lipase 23 11 - 51 U/L    Comment: Performed at [facility], [place] [address], [place], [place] [address]  Comprehensive metabolic panel     Status: Abnormal   Collection Time: 12/18/17  9:37 AM  Result Value Ref Range   Sodium 139 135 - 145 mmol/L   Potassium 3.7 3.5 - 5.1 mmol/L   Chloride 99 98 - 111 mmol/L   CO2 29 22 - 32 mmol/L   Glucose, Bld 178 (H) 70 - 99 mg/dL   BUN 24 (H) 8 - 23 mg/dL   Creatinine, Ser 1.17 0.61 - 1.24 mg/dL   Calcium 8.9 8.9 - 10.3 mg/dL   Total Protein 7.5 6.5 - 8.1 g/dL   Albumin 3.5 3.5 - 5.0 g/dL   AST 40 15 - 41 U/L   ALT 30 0 - 44 U/L   Alkaline Phosphatase 78 38 - 126 U/L   Total Bilirubin 1.2 0.3 - 1.2 mg/dL   GFR calc non Af Amer >60 >60 mL/min   GFR calc Af Amer >60 >60 mL/min    Comment: (NOTE) The eGFR has been calculated using the CKD EPI equation. This calculation has not been validated in all  clinical situations. eGFR's persistently <60 mL/min signify possible Chronic Kidney Disease.    Anion gap 11 5 - 15    Comment: Performed at [facility], [place] [address], [place], [place] [address]  CBC     Status: Abnormal   Collection Time: 12/18/17  9:37 AM  Result Value Ref Range   WBC 5.5 4.0 - 10.5 K/uL   RBC 4.78 4.22 - 5.81 MIL/uL   Hemoglobin 12.8 (L) 13.0 - 17.0 g/dL   HCT 39.4 39.0 - 52.0 %   MCV 82.4 80.0 - 100.0 fL   MCH 26.8 26.0 - 34.0 pg   MCHC 32.5 30.0 - 36.0 g/dL   RDW 14.1 11.5 - 15.5 %   Platelets 270 150 - 400 K/uL   nRBC 0.0 0.0 - 0.2 %    Comment: Performed at [facility], [place] [address] [place], [place] [address]  CBG monitoring, ED     Status: Abnormal   Collection Time: 12/18/17  9:38 AM  Result Value Ref Range   Glucose-Capillary 154 (H) 70 - 99 mg/dL   Comment 1 Notify RN    Comment 2 Document in Chart   I-Stat CG4 Lactic Acid, ED     Status: Abnormal   Collection Time: 12/18/17  9:43  AM  Result Value Ref Range   Lactic Acid, Venous 2.04 (HH) 0.5 - 1.9 mmol/L   Comment NOTIFIED PHYSICIAN   Urinalysis, Routine w reflex microscopic     Status: Abnormal   Collection Time: 12/18/17  9:50 AM  Result Value Ref Range   Color, Urine YELLOW YELLOW   APPearance CLEAR CLEAR   Specific Gravity, Urine 1.029 1.005 - 1.030   pH 5.0 5.0 - 8.0   Glucose, UA NEGATIVE NEGATIVE mg/dL   Hgb urine dipstick SMALL (A) NEGATIVE   Bilirubin Urine NEGATIVE NEGATIVE   Ketones, ur 5 (A) NEGATIVE mg/dL   Protein, ur 100 (A) NEGATIVE mg/dL   Nitrite NEGATIVE NEGATIVE   Leukocytes, UA NEGATIVE NEGATIVE   RBC / HPF 0-5 0 - 5 RBC/hpf   WBC, UA 0-5 0 - 5 WBC/hpf   Bacteria, UA RARE (A) NONE SEEN   Squamous Epithelial / LPF 0-5 0 - 5   Mucus PRESENT    Hyaline Casts, UA PRESENT     Comment: Performed at [facility], [place] [address] [place], [place] [address]  I-Stat CG4 Lactic Acid, ED     Status: Abnormal   Collection Time: 12/18/17 11:56 AM  Result Value Ref Range   Lactic Acid, Venous 1.91 (H) 0.5 - 1.9 mmol/L  CBC     Status: Abnormal   Collection Time: 12/18/17  3:01 PM  Result Value Ref Range   WBC 4.8 4.0 - 10.5 K/uL   RBC 4.64 4.22 - 5.81 MIL/uL   Hemoglobin 12.1 (L) 13.0 - 17.0 g/dL   HCT 38.6 (L) 39.0 - 52.0 %   MCV 83.2 80.0 - 100.0 fL   MCH 26.1 26.0 - 34.0 pg   MCHC 31.3 30.0 - 36.0 g/dL   RDW 14.4 11.5 - 15.5 %   Platelets 260 150 - 400 K/uL   nRBC 0.0 0.0 - 0.2 %    Comment: Performed at [facility], [place] [address], [place],  [address]  Creatinine, serum     Status: None   Collection Time: 12/18/17  3:01 PM  Result Value Ref Range   Creatinine, Ser 0.98 0.61 - 1.24 mg/dL  GFR calc non Af Amer >60 >60 mL/min   GFR calc Af Amer >60 >60 mL/min    Comment: (NOTE) The eGFR has been calculated using the CKD EPI equation. This calculation has not been validated in all clinical situations. eGFR's persistently <60 mL/min  signify possible Chronic Kidney Disease. Performed at [facility], [place] [address], [place], [place] [address]   CBG monitoring, ED     Status: Abnormal   Collection Time: 12/18/17  4:21 PM  Result Value Ref Range   Glucose-Capillary 106 (H) 70 - 99 mg/dL   Comment 1 Notify RN    Comment 2 Document in Chart    Ct Abdomen Pelvis W Contrast  Result Date: 12/18/2017 CLINICAL DATA:  Right hip surgery on Monday at the [place]. Lower abdominal pain. EXAM: CT ABDOMEN AND PELVIS WITH CONTRAST TECHNIQUE: Multidetector CT imaging of the abdomen and pelvis was performed using the standard protocol following bolus administration of intravenous contrast. CONTRAST:  176m ISOVUE-300 IOPAMIDOL (ISOVUE-300) INJECTION 61% COMPARISON:  None. FINDINGS: Lower chest: Small right pleural effusion. Hepatobiliary: Low attenuation of liver as can be seen with hepatic steatosis. 2.3 cm hypodense, fluid attenuating hepatic mass near the dome of the liver consistent with a cyst. Similar hypodense, fluid attenuating right hepatic mass measuring 16 mm consistent with a cyst. Pancreas: Unremarkable. No pancreatic ductal dilatation or surrounding inflammatory changes. Spleen: Normal in size without focal abnormality. Adrenals/Urinary Tract: Normal adrenal glands. No urolithiasis or obstructive uropathy. 7.7 cm hypodense, fluid attenuating left renal mass consistent with a cyst. 12 mm hypodense, fluid attenuating right inferior pole renal mass consistent with a cyst. Normal bladder. Stomach/Bowel: No pneumatosis, pneumoperitoneum or portal venous gas. Numerous dilated loops of small bowel measuring up to 3.7 cm with a relative transition point in the distal ileum in the lower mid abdomen most consistent with a bowel obstruction. Distal most ileum and terminal ileum are decompressed. Small amount of stool throughout the colon. Vascular/Lymphatic: No significant vascular findings are present. No enlarged abdominal or  pelvic lymph nodes. Reproductive: Prostate is unremarkable. Other: No abdominal wall hernia or abnormality. No abdominopelvic ascites. Musculoskeletal: Interval right total hip arthroplasty. Postsurgical changes surrounding the right hip within the soft tissues. Small smiled of soft tissue air in the right gluteal musculature. Moderate osteoarthritis of bilateral sacroiliac joints. IMPRESSION: 1. Numerous dilated loops of small bowel measuring up to 3.7 cm with a relative transition point in the distal ileum in the lower mid abdomen most consistent with a bowel obstruction. Electronically Signed   By: HKathreen Devoid  On: 12/18/2017 11:09   Dg Abdomen Acute W/chest  Result Date: 12/18/2017 CLINICAL DATA:  Abdominal pain EXAM: DG ABDOMEN ACUTE W/ 1V CHEST COMPARISON:  None. FINDINGS: Dilated small bowel loops in the mid abdomen with air-fluid levels compatible with small bowel obstruction. This appears moderate to high-grade. Gas and stool noted throughout decompressed colon. No organomegaly or free air. Heart and mediastinal contours are within normal limits. No focal opacities or effusions. No acute bony abnormality. IMPRESSION: Moderate to high-grade small bowel obstruction pattern. No free air. No acute cardiopulmonary disease. Electronically Signed   By: KRolm BaptiseM.D.   On: 12/18/2017 10:05   Dg Abd Portable 1v-small Bowel Protocol-position Verification  Result Date: 12/18/2017 CLINICAL DATA:  NG tube placement. EXAM: PORTABLE ABDOMEN - 1 VIEW COMPARISON:  None. FINDINGS: An NG tube is identified kinked in the stomach with tip extending cephalad in the distal esophagus. Bowel gas pattern is unchanged.  IMPRESSION: NG tube kinked in the stomach with tip in the distal esophagus pointing cephalad. Electronically Signed   By: Margarette Canada M.D.   On: 12/18/2017 14:58    Pending Labs Unresulted Labs (From admission, onward)    Start     Ordered   12/25/17 0500  Creatinine, serum  (enoxaparin (LOVENOX)     CrCl >/= 30 ml/min)  Weekly,   R    Comments:  while on enoxaparin therapy    12/18/17 1319   12/19/17 0500  CBC  Tomorrow morning,   R     12/18/17 1319   12/19/17 4712  Basic metabolic panel  Tomorrow morning,   R     12/18/17 1319   12/18/17 1315  HIV antibody (Routine Testing)  Once,   R     12/18/17 1319          Vitals/Pain Today's Vitals   12/18/17 1600 12/18/17 1630 12/18/17 1700 12/18/17 1730  BP: (!) 142/91 (!) 142/91 (!) 153/92 (!) 148/87  Pulse: (!) 104 (!) 103 (!) 102 97  Resp: (!) 21 20 (!) 22 20  Temp:      TempSrc:      SpO2: 97% 97% 99% 100%  PainSc:        Isolation Precautions No active isolations  Medications Medications  iopamidol (ISOVUE-300) 61 % injection (has no administration in time range)  sodium chloride (PF) 0.9 % injection (has no administration in time range)  0.9 %  sodium chloride infusion (has no administration in time range)  enoxaparin (LOVENOX) injection 40 mg (has no administration in time range)  0.9 % NaCl with KCl 20 mEq/ L  infusion (has no administration in time range)  acetaminophen (TYLENOL) tablet 650 mg (has no administration in time range)    Or  acetaminophen (TYLENOL) suppository 650 mg (has no administration in time range)  morphine 2 MG/ML injection 2-4 mg (has no administration in time range)  diphenhydrAMINE (BENADRYL) 12.5 MG/5ML elixir 12.5 mg (has no administration in time range)    Or  diphenhydrAMINE (BENADRYL) injection 12.5 mg (has no administration in time range)  ondansetron (ZOFRAN-ODT) disintegrating tablet 4 mg (has no administration in time range)    Or  ondansetron (ZOFRAN) injection 4 mg (has no administration in time range)  hydrALAZINE (APRESOLINE) injection 10 mg (has no administration in time range)  famotidine (PEPCID) IVPB 20 mg premix (has no administration in time range)  diatrizoate meglumine-sodium (GASTROGRAFIN) 66-10 % solution 90 mL (has no administration in time range)  insulin  aspart (novoLOG) injection 0-15 Units (has no administration in time range)  sodium chloride 0.9 % bolus 1,000 mL (0 mLs Intravenous Stopped 12/18/17 1137)  ondansetron (ZOFRAN) injection 4 mg (4 mg Intravenous Given 12/18/17 0937)  iopamidol (ISOVUE-300) 61 % injection 100 mL (100 mLs Intravenous Contrast Given 12/18/17 1037)  lidocaine (XYLOCAINE) 2 % jelly 1 application (1 application Tracheal Tube Given 12/18/17 1415)    Mobility non-ambulatory

## 2017-12-18 NOTE — H&P (Signed)
Richard Whitaker Consult/Admission Note  Richard Whitaker 1945/07/09  [id number].    Requesting MD: Dr. Francia Greaves Chief Complaint/Reason for Consult: SBO vs ileus  HPI:   Pt is a 72 yo male s/p right THA with a Dr. Quay Burow (Ortho) at the [facility] on Monday. He was discharged on Wednesday (2 days prior). Yesterday afternoon he developed nausea and vomiting. Was told to return to [place] but pt and wife decided to come to [facility] ED. Pt states he was not eating much at home. Yesterday he ate a little breakfast and by afternoon he had nausea and vomiting. Vomiting per wife looks like yellow baby food and has a foul odor but not green. Pt is complaining of mild, generalized, non radiating abdominal pain. He states some flatus yesterday but no BM since last Sunday. Spoke with wife and son. Pt has a hx of abdominal prostate Whitaker. No other abdominal surgeries and no anticoagulation. Wife states pt was taking a stool softener.   ED Course: Numerous dilated loops of small bowel measuring up to 3.7 cm with a relative transition point in the distal ileum in the lower mid abdomen most consistent with a bowel obstruction. Labs: lactic acid 1.91, BUN 24, Glucose 178, Hgb 12.8 all other labs unremarkable  ROS:  Review of Systems  Constitutional: Negative for chills, diaphoresis and fever.  HENT: Negative for sore throat.   Respiratory: Negative for cough and shortness of breath.   Cardiovascular: Negative for chest pain.  Gastrointestinal: Positive for abdominal pain, constipation, nausea and vomiting. Negative for diarrhea.  Genitourinary: Negative for dysuria.  Skin: Negative for rash.  Neurological: Negative for dizziness and loss of consciousness.  All other systems reviewed and are negative.    No family history on file.  Past Medical History:  Diagnosis Date  . Hypertension   . Prostate cancer ([place])   . Reflux     Past Surgical History:  Procedure Laterality Date  . TRANSURETHRAL  RESECTION OF PROSTATE      Social History:  reports that he quit smoking about 14 years ago. He has never used smokeless tobacco. He reports that he drinks alcohol. He reports that he does not use drugs.  Allergies: No Known Allergies   (Not in a hospital admission)  Blood pressure 135/89, pulse (!) 108, temperature 98.9 F (37.2 C), temperature source Oral, resp. rate (!) 21, SpO2 95 %.  Physical Exam  Constitutional: He is oriented to person, place, and time. He appears well-developed and well-nourished.  Non-toxic appearance. He does not appear ill. No distress.  HENT:  Head: Normocephalic and atraumatic.  Nose: Nose normal.  Mouth/Throat: Uvula is midline, oropharynx is clear and moist and mucous membranes are normal. No oropharyngeal exudate.  Eyes: Pupils are equal, round, and reactive to light. Conjunctivae are normal. Right eye exhibits no discharge. Left eye exhibits no discharge. No scleral icterus.  Neck: Normal range of motion. Neck supple. No thyromegaly present.  Cardiovascular: Normal rate, regular rhythm, normal heart sounds and intact distal pulses.  No murmur heard. Pulses:      Radial pulses are 2+ on the right side, and 2+ on the left side.       Posterior tibial pulses are 2+ on the right side, and 2+ on the left side.  Pulmonary/Chest: Effort normal and breath sounds normal. No respiratory distress. He has no wheezes. He has no rhonchi. He has no rales.  Abdominal: Normal appearance. He exhibits distension (mild). Bowel sounds are decreased.  Hepatosplenomegaly: unable to assess due to discomfort. There is generalized tenderness. There is guarding (mild). There is no rigidity.  Musculoskeletal: Normal range of motion. He exhibits no edema, tenderness or deformity.  Lymphadenopathy:    He has no cervical adenopathy.  Neurological: He is alert and oriented to person, place, and time.  Skin: Skin is warm and dry. No rash noted. He is not diaphoretic.  Nursing note  and vitals reviewed.   Results for orders placed or performed during the hospital encounter of 12/18/17 (from the past 48 hour(s))  Lipase, blood     Status: None   Collection Time: 12/18/17  9:37 AM  Result Value Ref Range   Lipase 23 11 - 51 U/L    Comment: Performed at [facility], [place] [address], [place], [place] [address]  Comprehensive metabolic panel     Status: Abnormal   Collection Time: 12/18/17  9:37 AM  Result Value Ref Range   Sodium 139 135 - 145 mmol/L   Potassium 3.7 3.5 - 5.1 mmol/L   Chloride 99 98 - 111 mmol/L   CO2 29 22 - 32 mmol/L   Glucose, Bld 178 (H) 70 - 99 mg/dL   BUN 24 (H) 8 - 23 mg/dL   Creatinine, Ser 1.17 0.61 - 1.24 mg/dL   Calcium 8.9 8.9 - 10.3 mg/dL   Total Protein 7.5 6.5 - 8.1 g/dL   Albumin 3.5 3.5 - 5.0 g/dL   AST 40 15 - 41 U/L   ALT 30 0 - 44 U/L   Alkaline Phosphatase 78 38 - 126 U/L   Total Bilirubin 1.2 0.3 - 1.2 mg/dL   GFR calc non Af Amer >60 >60 mL/min   GFR calc Af Amer >60 >60 mL/min    Comment: (NOTE) The eGFR has been calculated using the CKD EPI equation. This calculation has not been validated in all clinical situations. eGFR's persistently <60 mL/min signify possible Chronic Kidney Disease.    Anion gap 11 5 - 15    Comment: Performed at [facility], [place] [address], [place], [place] [address]  CBC     Status: Abnormal   Collection Time: 12/18/17  9:37 AM  Result Value Ref Range   WBC 5.5 4.0 - 10.5 K/uL   RBC 4.78 4.22 - 5.81 MIL/uL   Hemoglobin 12.8 (L) 13.0 - 17.0 g/dL   HCT 39.4 39.0 - 52.0 %   MCV 82.4 80.0 - 100.0 fL   MCH 26.8 26.0 - 34.0 pg   MCHC 32.5 30.0 - 36.0 g/dL   RDW 14.1 11.5 - 15.5 %   Platelets 270 150 - 400 K/uL   nRBC 0.0 0.0 - 0.2 %    Comment: Performed at [facility], [place] [address] [place], [place] [address]  CBG monitoring, ED     Status: Abnormal   Collection Time: 12/18/17  9:38 AM  Result Value Ref Range    Glucose-Capillary 154 (H) 70 - 99 mg/dL   Comment 1 Notify RN    Comment 2 Document in Chart   I-Stat CG4 Lactic Acid, ED     Status: Abnormal   Collection Time: 12/18/17  9:43 AM  Result Value Ref Range   Lactic Acid, Venous 2.04 (HH) 0.5 - 1.9 mmol/L   Comment NOTIFIED PHYSICIAN   Urinalysis, Routine w reflex microscopic     Status: Abnormal   Collection Time: 12/18/17  9:50 AM  Result Value Ref Range   Color, Urine  YELLOW YELLOW   APPearance CLEAR CLEAR   Specific Gravity, Urine 1.029 1.005 - 1.030   pH 5.0 5.0 - 8.0   Glucose, UA NEGATIVE NEGATIVE mg/dL   Hgb urine dipstick SMALL (A) NEGATIVE   Bilirubin Urine NEGATIVE NEGATIVE   Ketones, ur 5 (A) NEGATIVE mg/dL   Protein, ur 100 (A) NEGATIVE mg/dL   Nitrite NEGATIVE NEGATIVE   Leukocytes, UA NEGATIVE NEGATIVE   RBC / HPF 0-5 0 - 5 RBC/hpf   WBC, UA 0-5 0 - 5 WBC/hpf   Bacteria, UA RARE (A) NONE SEEN   Squamous Epithelial / LPF 0-5 0 - 5   Mucus PRESENT    Hyaline Casts, UA PRESENT     Comment: Performed at [facility], [place] [address] [place], [place] [address]  I-Stat CG4 Lactic Acid, ED     Status: Abnormal   Collection Time: 12/18/17 11:56 AM  Result Value Ref Range   Lactic Acid, Venous 1.91 (H) 0.5 - 1.9 mmol/L   Ct Abdomen Pelvis W Contrast  Result Date: 12/18/2017 CLINICAL DATA:  Right hip Whitaker on Monday at the [place]. Lower abdominal pain. EXAM: CT ABDOMEN AND PELVIS WITH CONTRAST TECHNIQUE: Multidetector CT imaging of the abdomen and pelvis was performed using the standard protocol following bolus administration of intravenous contrast. CONTRAST:  153m ISOVUE-300 IOPAMIDOL (ISOVUE-300) INJECTION 61% COMPARISON:  None. FINDINGS: Lower chest: Small right pleural effusion. Hepatobiliary: Low attenuation of liver as can be seen with hepatic steatosis. 2.3 cm hypodense, fluid attenuating hepatic mass near the dome of the liver consistent with a cyst. Similar hypodense, fluid attenuating right hepatic  mass measuring 16 mm consistent with a cyst. Pancreas: Unremarkable. No pancreatic ductal dilatation or surrounding inflammatory changes. Spleen: Normal in size without focal abnormality. Adrenals/Urinary Tract: Normal adrenal glands. No urolithiasis or obstructive uropathy. 7.7 cm hypodense, fluid attenuating left renal mass consistent with a cyst. 12 mm hypodense, fluid attenuating right inferior pole renal mass consistent with a cyst. Normal bladder. Stomach/Bowel: No pneumatosis, pneumoperitoneum or portal venous gas. Numerous dilated loops of small bowel measuring up to 3.7 cm with a relative transition point in the distal ileum in the lower mid abdomen most consistent with a bowel obstruction. Distal most ileum and terminal ileum are decompressed. Small amount of stool throughout the colon. Vascular/Lymphatic: No significant vascular findings are present. No enlarged abdominal or pelvic lymph nodes. Reproductive: Prostate is unremarkable. Other: No abdominal wall hernia or abnormality. No abdominopelvic ascites. Musculoskeletal: Interval right total hip arthroplasty. Postsurgical changes surrounding the right hip within the soft tissues. Small smiled of soft tissue air in the right gluteal musculature. Moderate osteoarthritis of bilateral sacroiliac joints. IMPRESSION: 1. Numerous dilated loops of small bowel measuring up to 3.7 cm with a relative transition point in the distal ileum in the lower mid abdomen most consistent with a bowel obstruction. Electronically Signed   By: HKathreen Devoid  On: 12/18/2017 11:09   Dg Abdomen Acute W/chest  Result Date: 12/18/2017 CLINICAL DATA:  Abdominal pain EXAM: DG ABDOMEN ACUTE W/ 1V CHEST COMPARISON:  None. FINDINGS: Dilated small bowel loops in the mid abdomen with air-fluid levels compatible with small bowel obstruction. This appears moderate to high-grade. Gas and stool noted throughout decompressed colon. No organomegaly or free air. Heart and mediastinal  contours are within normal limits. No focal opacities or effusions. No acute bony abnormality. IMPRESSION: Moderate to high-grade small bowel obstruction pattern. No free air. No acute cardiopulmonary disease. Electronically Signed   By:  Rolm Baptise M.D.   On: 12/18/2017 10:05      Assessment/Plan Active Problems:   SBO (small bowel obstruction) (HCC)  DM - SSI HTN - hydrazine PRN S/P R hip 11/18 at [facility]  SBO vs ileus - admit to [facility], small bowel protocol   FEN: NPO, NGT, IVF VTE: SCD's, lovenox ID: None Foley: none Follow up: TBD  Plan: Hopefull this is related to recent Whitaker and likely constipation from narcotics and will resolve without the need for Whitaker. This could be due to adhesions from previous abdominal prostate Whitaker. Will follow xrays from SBO protocol.    Kalman Drape, [facility] Whitaker 12/18/2017, 1:19 PM Pager: [phone] Consults: [phone] Mon-Fri 7:00 am-4:30 pm Sat-Sun 7:00 am-11:30 am

## 2017-12-18 NOTE — ED Notes (Signed)
Pt has urinal at bedside as is aware of need for urine specimen

## 2017-12-18 NOTE — ED Triage Notes (Signed)
Pt had right surgery on MOnday at [place] in [place]. Pt had abd pains with n/v since yesterday. No BM Sunday morning.

## 2017-12-18 NOTE — ED Provider Notes (Signed)
[facility] DEPT Provider Note   CSN: [id number] Arrival date & time: 12/18/17  6378     History   Chief Complaint Chief Complaint  Patient presents with  . Abdominal Pain  . Emesis    HPI Richard Whitaker is a 72 y.o. male.  72 year old male with prior medical history as detailed below presents for evaluation of nausea and vomiting. Patient reports recent right THA with a Dr. Quay Burow (Ortho) at the [facility] on Monday. He was discharged on Wednesday (2 days prior). Yesterday afternoon he developed nausea and vomiting. He denies associated fever. He reports low suprapubic pain. His last BM was this past Sunday (5 days prior). He reports darker urine over the last 24 hours.   He and his wife contacted the [facility] this morning. They were instructed to present the ED there. However, that would have required an hour drive and so they came to the ED at [facility] instead.    The history is provided by the patient, medical records and the spouse.  Abdominal Pain   This is a new problem. The current episode started yesterday. The problem occurs rarely. The problem has not changed since onset.The pain is located in the suprapubic region. The quality of the pain is aching. The pain is mild. Associated symptoms include vomiting. Pertinent negatives include fever. Nothing aggravates the symptoms. Nothing relieves the symptoms.  Emesis   Associated symptoms include abdominal pain. Pertinent negatives include no fever.    Past Medical History:  Diagnosis Date  . Hypertension   . Prostate cancer ([place])   . Reflux     There are no active problems to display for this patient.   Past Surgical History:  Procedure Laterality Date  . TRANSURETHRAL RESECTION OF PROSTATE          Home Medications    Prior to Admission medications   Medication Sig Start Date End Date Taking? Authorizing Provider  Ascorbic Acid (VITAMIN C PO) Take 1 tablet by mouth daily.      [provider]  metFORMIN (GLUCOPHAGE) 500 MG tablet Take 1 tablet (500 mg total) by mouth 2 (two) times daily with a meal. 08/22/13   Pollina, Gwenyth Allegra, MD  Multiple Vitamin (MULTIVITAMIN WITH MINERALS) TABS tablet Take 1 tablet by mouth daily.    [provider]  omega-3 acid ethyl esters (LOVAZA) 1 G capsule Take 1 g by mouth daily.    [provider]  omeprazole (PRILOSEC) 20 MG capsule Take 20 mg by mouth daily.    [provider]  triamterene-hydrochlorothiazide (MAXZIDE) 75-50 MG per tablet Take 0.5 tablets by mouth daily.    [provider]    Family History No family history on file.  Social History Social History   Tobacco Use  . Smoking status: Former Smoker    Last attempt to quit: 08/23/2003    Years since quitting: 14.3  . Smokeless tobacco: Never Used  Substance Use Topics  . Alcohol use: Yes    Comment: occ  . Drug use: No     Allergies   Patient has no known allergies.   Review of Systems Review of Systems  Constitutional: Negative for fever.  Gastrointestinal: Positive for abdominal pain and vomiting.  All other systems reviewed and are negative.    Physical Exam Updated Vital Signs BP (!) 135/109 (BP Location: Right Arm)   Pulse (!) 125   Temp 98.9 F (37.2 C) (Oral)  Resp 20   SpO2 100%   Physical Exam  Constitutional: He is oriented to person, place, and time. He appears well-developed and well-nourished. No distress.  HENT:  Head: Normocephalic and atraumatic.  Mouth/Throat: Oropharynx is clear and moist.  Eyes: Pupils are equal, round, and reactive to light. Conjunctivae and EOM are normal.  Neck: Normal range of motion. Neck supple.  Cardiovascular: Normal rate, regular rhythm and normal heart sounds.  Pulmonary/Chest: Effort normal and breath sounds normal. No respiratory distress.  Abdominal: Soft. He exhibits no distension. There is tenderness in the suprapubic area. There is no  rigidity and no guarding.  Musculoskeletal: Normal range of motion. He exhibits no edema or deformity.  Neurological: He is alert and oriented to person, place, and time.  Skin: Skin is warm and dry.  Psychiatric: He has a normal mood and affect.  Nursing note and vitals reviewed.    ED Treatments / Results  Labs (all labs ordered are listed, but only abnormal results are displayed) Labs Reviewed  COMPREHENSIVE METABOLIC PANEL - Abnormal; Notable for the following components:      Result Value   Glucose, Bld 178 (*)    BUN 24 (*)    All other components within normal limits  CBC - Abnormal; Notable for the following components:   Hemoglobin 12.8 (*)    All other components within normal limits  URINALYSIS, ROUTINE W REFLEX MICROSCOPIC - Abnormal; Notable for the following components:   Hgb urine dipstick SMALL (*)    Ketones, ur 5 (*)    Protein, ur 100 (*)    Bacteria, UA RARE (*)    All other components within normal limits  I-STAT CG4 LACTIC ACID, ED - Abnormal; Notable for the following components:   Lactic Acid, Venous 2.04 (*)    All other components within normal limits  CBG MONITORING, ED - Abnormal; Notable for the following components:   Glucose-Capillary 154 (*)    All other components within normal limits  I-STAT CG4 LACTIC ACID, ED - Abnormal; Notable for the following components:   Lactic Acid, Venous 1.91 (*)    All other components within normal limits  LIPASE, BLOOD    EKG EKG Interpretation  Date/Time:  Friday December 18 2017 09:21:08 EST Ventricular Rate:  105 PR Interval:    QRS Duration: 92 QT Interval:  345 QTC Calculation: 456 R Axis:   0 Text Interpretation:  Sinus tachycardia Abnormal R-wave progression, late transition Confirmed by Dene Gentry [phone]) on 12/18/2017 9:24:10 AM   Radiology Ct Abdomen Pelvis W Contrast  Result Date: 12/18/2017 CLINICAL DATA:  Right hip surgery on Monday at the [facility]. Lower abdominal pain. EXAM: CT  ABDOMEN AND PELVIS WITH CONTRAST TECHNIQUE: Multidetector CT imaging of the abdomen and pelvis was performed using the standard protocol following bolus administration of intravenous contrast. CONTRAST:  168mL ISOVUE-300 IOPAMIDOL (ISOVUE-300) INJECTION 61% COMPARISON:  None. FINDINGS: Lower chest: Small right pleural effusion. Hepatobiliary: Low attenuation of liver as can be seen with hepatic steatosis. 2.3 cm hypodense, fluid attenuating hepatic mass near the dome of the liver consistent with a cyst. Similar hypodense, fluid attenuating right hepatic mass measuring 16 mm consistent with a cyst. Pancreas: Unremarkable. No pancreatic ductal dilatation or surrounding inflammatory changes. Spleen: Normal in size without focal abnormality. Adrenals/Urinary Tract: Normal adrenal glands. No urolithiasis or obstructive uropathy. 7.7 cm hypodense, fluid attenuating left renal mass consistent with a cyst. 12 mm hypodense, fluid attenuating right inferior pole renal mass consistent with a cyst.  Normal bladder. Stomach/Bowel: No pneumatosis, pneumoperitoneum or portal venous gas. Numerous dilated loops of small bowel measuring up to 3.7 cm with a relative transition point in the distal ileum in the lower mid abdomen most consistent with a bowel obstruction. Distal most ileum and terminal ileum are decompressed. Small amount of stool throughout the colon. Vascular/Lymphatic: No significant vascular findings are present. No enlarged abdominal or pelvic lymph nodes. Reproductive: Prostate is unremarkable. Other: No abdominal wall hernia or abnormality. No abdominopelvic ascites. Musculoskeletal: Interval right total hip arthroplasty. Postsurgical changes surrounding the right hip within the soft tissues. Small smiled of soft tissue air in the right gluteal musculature. Moderate osteoarthritis of bilateral sacroiliac joints. IMPRESSION: 1. Numerous dilated loops of small bowel measuring up to 3.7 cm with a relative transition  point in the distal ileum in the lower mid abdomen most consistent with a bowel obstruction. Electronically Signed   By: Kathreen Devoid   On: 12/18/2017 11:09   Dg Abdomen Acute W/chest  Result Date: 12/18/2017 CLINICAL DATA:  Abdominal pain EXAM: DG ABDOMEN ACUTE W/ 1V CHEST COMPARISON:  None. FINDINGS: Dilated small bowel loops in the mid abdomen with air-fluid levels compatible with small bowel obstruction. This appears moderate to high-grade. Gas and stool noted throughout decompressed colon. No organomegaly or free air. Heart and mediastinal contours are within normal limits. No focal opacities or effusions. No acute bony abnormality. IMPRESSION: Moderate to high-grade small bowel obstruction pattern. No free air. No acute cardiopulmonary disease. Electronically Signed   By: Rolm Baptise M.D.   On: 12/18/2017 10:05    Procedures Procedures (including critical care time)  Medications Ordered in ED Medications  sodium chloride 0.9 % bolus 1,000 mL (has no administration in time range)  ondansetron (ZOFRAN) injection 4 mg (has no administration in time range)     Initial Impression / Assessment and Plan / ED Course  I have reviewed the triage vital signs and the nursing notes.  Pertinent labs & imaging results that were available during my care of the patient were reviewed by me and considered in my medical decision making (see chart for details).     MDM  Screen complete  Patient is presenting for evaluation of nausea, vomiting, and abdominal pain.  Patient's work-up suggests possible bowel obstruction vs ileus.   Case discussed with surgery and they have evaluated the patient.  They will admit the patient for further work-up and treatment.    Final Clinical Impressions(s) / ED Diagnoses   Final diagnoses:  Intestinal obstruction, unspecified cause, unspecified whether partial or complete [facility])    ED Discharge Orders    None       Valarie Merino, MD 12/18/17 1314

## 2017-12-19 ENCOUNTER — Inpatient Hospital Stay (HOSPITAL_COMMUNITY): Payer: No Typology Code available for payment source

## 2017-12-19 LAB — GLUCOSE, CAPILLARY
GLUCOSE-CAPILLARY: 103 mg/dL — AB (ref 70–99)
GLUCOSE-CAPILLARY: 131 mg/dL — AB (ref 70–99)
Glucose-Capillary: 108 mg/dL — ABNORMAL HIGH (ref 70–99)
Glucose-Capillary: 104 mg/dL — ABNORMAL HIGH (ref 70–99)
Glucose-Capillary: 95 mg/dL (ref 70–99)
Glucose-Capillary: 103 mg/dL — ABNORMAL HIGH (ref 70–99)

## 2017-12-19 LAB — CBC
HCT: 38.6 % — ABNORMAL LOW (ref 39.0–52.0)
Hemoglobin: 12 g/dL — ABNORMAL LOW (ref 13.0–17.0)
MCH: 25.7 pg — AB (ref 26.0–34.0)
MCHC: 31.1 g/dL (ref 30.0–36.0)
MCV: 82.7 fL (ref 80.0–100.0)
PLATELETS: 272 10*3/uL (ref 150–400)
RBC: 4.67 MIL/uL (ref 4.22–5.81)
RDW: 14.4 % (ref 11.5–15.5)
WBC: 4.5 10*3/uL (ref 4.0–10.5)
nRBC: 0 % (ref 0.0–0.2)

## 2017-12-19 LAB — BASIC METABOLIC PANEL
Anion gap: 9 (ref 5–15)
BUN: 21 mg/dL (ref 8–23)
CHLORIDE: 104 mmol/L (ref 98–111)
CO2: 31 mmol/L (ref 22–32)
CREATININE: 1.04 mg/dL (ref 0.61–1.24)
Calcium: 8.7 mg/dL — ABNORMAL LOW (ref 8.9–10.3)
GFR calc Af Amer: >60 mL/min (ref >60)
Glucose, Bld: 114 mg/dL — ABNORMAL HIGH (ref 70–99)
Potassium: 3.4 mmol/L — ABNORMAL LOW (ref 3.5–5.1)
SODIUM: 144 mmol/L (ref 135–145)

## 2017-12-19 LAB — HIV ANTIBODY (ROUTINE TESTING W REFLEX): HIV SCREEN 4TH GENERATION: NONREACTIVE

## 2017-12-19 MED ORDER — DIATRIZOATE MEGLUMINE & SODIUM 66-10 % PO SOLN
90.0000 mL | Freq: Once | ORAL | Status: AC
  Administered 2017-12-19: 90 mL via NASOGASTRIC
  Filled 2017-12-19: qty 90

## 2017-12-19 MED ORDER — POTASSIUM CHLORIDE 10 MEQ/100ML IV SOLN
10.0000 meq | INTRAVENOUS | Status: AC
  Administered 2017-12-19 (×2): 10 meq via INTRAVENOUS
  Filled 2017-12-19 (×2): qty 100

## 2017-12-19 MED ORDER — ORAL CARE MOUTH RINSE
15.0000 mL | Freq: Two times a day (BID) | OROMUCOSAL | Status: DC
  Administered 2017-12-19 – 2017-12-25 (×6): 15 mL via OROMUCOSAL

## 2017-12-19 NOTE — Progress Notes (Addendum)
Subjective/Chief Complaint: No flatus/bm States he threw last night- issues with NG being kinked Family at [facility]   Objective: Vital signs in last 24 hours: Temp:  [98.7 F (37.1 C)-99.7 F (37.6 C)] 99.7 F (37.6 C) (11/23 0532) Pulse Rate:  [93-104] 93 (11/23 0416) Resp:  [14-25] 14 (11/23 0416) BP: (139-170)/(85-110) 139/89 (11/23 0416) SpO2:  [97 %-100 %] 100 % (11/23 0416)    Intake/Output from previous day: 11/22 0701 - 11/23 0700 In: 1287.5 [I.V.:197.5; NG/GT:90; IV Piggyback:1000] Out: 680 [Emesis/NG output:680] Intake/Output this shift: Total I/O In: -  Out: 400 [Emesis/NG output:400]  Alert, nontoxic cta Reg Soft, min distension, nt, hypoBS I pulled NG back some and flushed it Rt hip -dressing intact No edema  Lab Results:  Recent Labs    12/18/17 1501 12/19/17 0515  WBC 4.8 4.5  HGB 12.1* 12.0*  HCT 38.6* 38.6*  PLT 260 272   BMET Recent Labs    12/18/17 0937 12/18/17 1501 12/19/17 0515  NA 139  --  144  K 3.7  --  3.4*  CL 99  --  104  CO2 29  --  31  GLUCOSE 178*  --  114*  BUN 24*  --  21  CREATININE 1.17 0.98 1.04  CALCIUM 8.9  --  8.7*   PT/INR No results for input(s): LABPROT, INR in the last 72 hours. ABG No results for input(s): PHART, HCO3 in the last 72 hours.  Invalid input(s): PCO2, PO2  Studies/Results: Ct Abdomen Pelvis W Contrast  Result Date: 12/18/2017 CLINICAL DATA:  Right hip surgery on Monday at the [place]. Lower abdominal pain. EXAM: CT ABDOMEN AND PELVIS WITH CONTRAST TECHNIQUE: Multidetector CT imaging of the abdomen and pelvis was performed using the standard protocol following bolus administration of intravenous contrast. CONTRAST:  11mL ISOVUE-300 IOPAMIDOL (ISOVUE-300) INJECTION 61% COMPARISON:  None. FINDINGS: Lower chest: Small right pleural effusion. Hepatobiliary: Low attenuation of liver as can be seen with hepatic steatosis. 2.3 cm hypodense, fluid attenuating hepatic mass near the dome of the liver  consistent with a cyst. Similar hypodense, fluid attenuating right hepatic mass measuring 16 mm consistent with a cyst. Pancreas: Unremarkable. No pancreatic ductal dilatation or surrounding inflammatory changes. Spleen: Normal in size without focal abnormality. Adrenals/Urinary Tract: Normal adrenal glands. No urolithiasis or obstructive uropathy. 7.7 cm hypodense, fluid attenuating left renal mass consistent with a cyst. 12 mm hypodense, fluid attenuating right inferior pole renal mass consistent with a cyst. Normal bladder. Stomach/Bowel: No pneumatosis, pneumoperitoneum or portal venous gas. Numerous dilated loops of small bowel measuring up to 3.7 cm with a relative transition point in the distal ileum in the lower mid abdomen most consistent with a bowel obstruction. Distal most ileum and terminal ileum are decompressed. Small amount of stool throughout the colon. Vascular/Lymphatic: No significant vascular findings are present. No enlarged abdominal or pelvic lymph nodes. Reproductive: Prostate is unremarkable. Other: No abdominal wall hernia or abnormality. No abdominopelvic ascites. Musculoskeletal: Interval right total hip arthroplasty. Postsurgical changes surrounding the right hip within the soft tissues. Small smiled of soft tissue air in the right gluteal musculature. Moderate osteoarthritis of bilateral sacroiliac joints. IMPRESSION: 1. Numerous dilated loops of small bowel measuring up to 3.7 cm with a relative transition point in the distal ileum in the lower mid abdomen most consistent with a bowel obstruction. Electronically Signed   By: Kathreen Devoid   On: 12/18/2017 11:09   Dg Abdomen Acute W/chest  Result Date: 12/18/2017 CLINICAL DATA:  Abdominal  pain EXAM: DG ABDOMEN ACUTE W/ 1V CHEST COMPARISON:  None. FINDINGS: Dilated small bowel loops in the mid abdomen with air-fluid levels compatible with small bowel obstruction. This appears moderate to high-grade. Gas and stool noted throughout  decompressed colon. No organomegaly or free air. Heart and mediastinal contours are within normal limits. No focal opacities or effusions. No acute bony abnormality. IMPRESSION: Moderate to high-grade small bowel obstruction pattern. No free air. No acute cardiopulmonary disease. Electronically Signed   By: Rolm Baptise M.D.   On: 12/18/2017 10:05   Dg Abd Portable 1v-small Bowel Obstruction Protocol-initial, 8 Hr Delay  Result Date: 12/19/2017 CLINICAL DATA:  8 hour delay for small bowel obstruction EXAM: PORTABLE ABDOMEN - 1 VIEW COMPARISON:  None. FINDINGS: There is no enteric contrast visualized within the imaged portion of the abdomen. The enteric tube is coiled in the right upper quadrant and tip is not visualized. There is dilated upper abdominal small bowel. IMPRESSION: 1. No enteric contrast visualized within the imaged portion of the abdomen. 2. Dilated upper abdominal small bowel loops, compatible with obstruction. 3. Enteric tube tip not visualized, beyond the superior extent of the field-of-view. Electronically Signed   By: Ulyses Jarred M.D.   On: 12/19/2017 05:38   Dg Abd Portable 1v-small Bowel Protocol-position Verification  Result Date: 12/18/2017 CLINICAL DATA:  NG tube placement. EXAM: PORTABLE ABDOMEN - 1 VIEW COMPARISON:  None. FINDINGS: An NG tube is identified kinked in the stomach with tip extending cephalad in the distal esophagus. Bowel gas pattern is unchanged. IMPRESSION: NG tube kinked in the stomach with tip in the distal esophagus pointing cephalad. Electronically Signed   By: Margarette Canada M.D.   On: 12/18/2017 14:58    Anti-infectives: Anti-infectives (From admission, onward)   None      Assessment/Plan: psbo vs ileus due to constipation from recent hip surgery S/p Rt THA at [facility] Hypokalemia  HTN DM 2  Will repeat SBO protocol as I believe he vomited up the contrast and can't see any contrast on film. Discussed with nurse -Replace  potassium -discussed his recent hip surgery with Dr Lorin Mercy with ortho - rec PT consult, WBAT -cont chemical vte prophylaxis -cont SSI, bs ok  Discussed all above with family and mgmt of sbo  LOS: 1 day    Greer Pickerel 12/19/2017

## 2017-12-20 LAB — BASIC METABOLIC PANEL
ANION GAP: 9 (ref 5–15)
BUN: 20 mg/dL (ref 8–23)
CHLORIDE: 113 mmol/L — AB (ref 98–111)
CO2: 26 mmol/L (ref 22–32)
Calcium: 8.5 mg/dL — ABNORMAL LOW (ref 8.9–10.3)
Creatinine, Ser: 0.95 mg/dL (ref 0.61–1.24)
GFR calc non Af Amer: >60 mL/min (ref >60)
Glucose, Bld: 95 mg/dL (ref 70–99)
Potassium: 3.5 mmol/L (ref 3.5–5.1)
Sodium: 148 mmol/L — ABNORMAL HIGH (ref 135–145)

## 2017-12-20 LAB — CBC
HCT: 35.6 % — ABNORMAL LOW (ref 39.0–52.0)
Hemoglobin: 10.8 g/dL — ABNORMAL LOW (ref 13.0–17.0)
MCH: 26 pg (ref 26.0–34.0)
MCHC: 30.3 g/dL (ref 30.0–36.0)
MCV: 85.8 fL (ref 80.0–100.0)
NRBC: 0 % (ref 0.0–0.2)
Platelets: 263 10*3/uL (ref 150–400)
RBC: 4.15 MIL/uL — ABNORMAL LOW (ref 4.22–5.81)
RDW: 14.6 % (ref 11.5–15.5)
WBC: 4.3 10*3/uL (ref 4.0–10.5)

## 2017-12-20 LAB — GLUCOSE, CAPILLARY
GLUCOSE-CAPILLARY: 96 mg/dL (ref 70–99)
GLUCOSE-CAPILLARY: 92 mg/dL (ref 70–99)
Glucose-Capillary: 83 mg/dL (ref 70–99)
Glucose-Capillary: 104 mg/dL — ABNORMAL HIGH (ref 70–99)
Glucose-Capillary: 77 mg/dL (ref 70–99)
Glucose-Capillary: 106 mg/dL — ABNORMAL HIGH (ref 70–99)

## 2017-12-20 LAB — MAGNESIUM: MAGNESIUM: 2 mg/dL (ref 1.7–2.4)

## 2017-12-20 MED ORDER — KCL IN DEXTROSE-NACL 20-5-0.45 MEQ/L-%-% IV SOLN
INTRAVENOUS | Status: DC
  Filled 2017-12-20: qty 1000

## 2017-12-20 MED ORDER — KCL IN DEXTROSE-NACL 20-5-0.45 MEQ/L-%-% IV SOLN
INTRAVENOUS | Status: DC
  Administered 2017-12-20 – 2017-12-24 (×9): via INTRAVENOUS
  Administered 2017-12-25: 1000 mL via INTRAVENOUS
  Filled 2017-12-20 (×20): qty 1000

## 2017-12-20 NOTE — Care Management Note (Addendum)
Case Management Note  Patient Details  Name: Richard Whitaker MRN: [id number] Date of Birth: [birth year]  Subjective/Objective:    SBO, s/p Right THA at [facility]              Action/Plan: NCM spoke to pt and gave permission to contact wife via phone. He has RW, shower chair, elevated toilet and reacher at home. Contacted wife, [name] via phone. States his HH was arranged with [facility] for PT, OT and aide. Will notify [facility] for resumption of care. Will need a resumption of care order for HHPT, OT and aide.  His surgery was at [facility], His PCP is at [facility] Please fax dc summary to [facility], PCP Dr Ishmael Holter with [facility]  Expected Discharge Date:             Expected Discharge Plan:  Home/Self Care  In-House Referral:  NA  Discharge planning Services  CM Consult  Post Acute Care Choice:  NA Choice offered to:  NA  DME Arranged:  N/A DME Agency:  NA  HH Arranged:  PT, OT, aide [place] Agency:  [facility]  Status of Service:  Completed, signed off  If discussed at [employer], dates discussed:    Additional Comments:  Erenest Rasher, RN 12/20/2017, 12:46 PM

## 2017-12-20 NOTE — Evaluation (Signed)
Physical Therapy Evaluation Patient Details Name: Richard Whitaker MRN: [id number] DOB: [birth year] Today's Date: 12/20/2017   History of Present Illness  72 year old male s/p right THA with a Dr. Quay Burow (Ortho) at the [facility] on 12/14/17.  Once home he devopled SBO.  PMH includes prostate CA.  Clinical Impression  Pt admitted with above diagnosis. Pt currently with functional limitations due to the deficits listed below (see PT Problem List).  Pt will benefit from skilled PT to increase their independence and safety with mobility to allow discharge to the venue listed below.  Recommend HHPT.     Follow Up Recommendations Home health PT    Equipment Recommendations  None recommended by PT    Recommendations for Other Services       Precautions / Restrictions Precautions Precautions: Anterior Hip;Other (comment)(NG tube) Precaution Comments: Reviewed precautions Restrictions Weight Bearing Restrictions: Yes RLE Weight Bearing: Weight bearing as tolerated(per MD note and pt and wife WBAT, but orders state TDWB, have asked for clarification of order, but treated as WBAT per wife report)      Mobility  Bed Mobility Overal bed mobility: Needs Assistance Bed Mobility: Supine to Sit     Supine to sit: Supervision;HOB elevated     General bed mobility comments: Pt able to use L LE to hook under R LE and get to EOB with rail and HOB elevated  Transfers Overall transfer level: Needs assistance Equipment used: Rolling walker (2 wheeled) Transfers: Sit to/from Stand Sit to Stand: Min guard         General transfer comment: cues for safe technique and hand placement  Ambulation/Gait Ambulation/Gait assistance: Min guard;Supervision Gait Distance (Feet): 300 Feet Assistive device: Rolling walker (2 wheeled) Gait Pattern/deviations: Step-through pattern Gait velocity: decreased   General Gait Details: As gait progressed, pt's cadence improved. He was able to have step  through pattern throughout gait.  Stairs            Wheelchair Mobility    Modified Rankin (Stroke Patients Only)       Balance Overall balance assessment: Mild deficits observed, not formally tested                                           Pertinent Vitals/Pain Pain Assessment: 0-10 Pain Score: 3  Pain Location: abd Pain Intervention(s): Monitored during session;Limited activity within patient's tolerance    Home Living Family/patient expects to be discharged to:: Private residence Living Arrangements: Spouse/significant other Available Help at Discharge: Family Type of Home: House Home Access: Stairs to enter   Technical brewer of Steps: 3 Home Layout: One level Home Equipment: Environmental consultant - 2 wheels Additional Comments: getting a raised commode seat    Prior Function Level of Independence: Independent               Hand Dominance        Extremity/Trunk Assessment   Upper Extremity Assessment Upper Extremity Assessment: Defer to OT evaluation    Lower Extremity Assessment Lower Extremity Assessment: RLE deficits/detail RLE Deficits / Details: R hip AROM 60 degrees in supine       Communication   Communication: No difficulties  Cognition Arousal/Alertness: Awake/alert Behavior During Therapy: WFL for tasks assessed/performed Overall Cognitive Status: Within Functional Limits for tasks assessed  General Comments      Exercises Total Joint Exercises Ankle Circles/Pumps: AROM Quad Sets: Strengthening;Right;Supine Gluteal Sets: Strengthening;Right;Supine Heel Slides: AAROM;AROM;Right;Supine Hip ABduction/ADduction: AROM;AAROM;Right;Supine   Assessment/Plan    PT Assessment Patient needs continued PT services  PT Problem List Decreased strength;Decreased range of motion;Decreased activity tolerance;Decreased balance;Decreased knowledge of precautions;Decreased  mobility;Decreased knowledge of use of DME       PT Treatment Interventions DME instruction;Gait training;Stair training;Functional mobility training;Balance training;Therapeutic exercise;Therapeutic activities    PT Goals (Current goals can be found in the Care Plan section)  Acute Rehab PT Goals Patient Stated Goal: go home PT Goal Formulation: With patient/family Time For Goal Achievement: 01/03/18 Potential to Achieve Goals: Good    Frequency Min 3X/week   Barriers to discharge        Co-evaluation               AM-PAC PT "6 Clicks" Mobility  Outcome Measure Help needed turning from your back to your side while in a flat bed without using bedrails?: A Little Help needed moving from lying on your back to sitting on the side of a flat bed without using bedrails?: A Little Help needed moving to and from a bed to a chair (including a wheelchair)?: A Little Help needed standing up from a chair using your arms (e.g., wheelchair or bedside chair)?: A Little Help needed to walk in hospital room?: A Little Help needed climbing 3-5 steps with a railing? : A Little 6 Click Score: 18    End of Session Equipment Utilized During Treatment: Gait belt Activity Tolerance: Patient tolerated treatment well Patient left: in chair;with call bell/phone within reach Nurse Communication: Mobility status PT Visit Diagnosis: Difficulty in walking, not elsewhere classified (R26.2);Muscle weakness (generalized) (M62.81)    Time: 6440-3474 PT Time Calculation (min) (ACUTE ONLY): 38 min   Charges:   PT Evaluation $PT Eval Moderate Complexity: 1 Mod PT Treatments $Gait Training: 8-22 mins $Therapeutic Exercise: 8-22 mins         L. Tamala Julian, [place] Pager [phone] 12/20/2017   [name] 12/20/2017, 12:41 PM

## 2017-12-20 NOTE — Evaluation (Signed)
Occupational Therapy Evaluation Patient Details Name: Richard Whitaker MRN: [id number] DOB: [birth year] Today's Date: 12/20/2017    History of Present Illness 72 year old male s/p right THA with a Dr. Quay Burow (Ortho) at the [facility] on 12/14/17.  Once home he devopled SBO.  PMH includes prostate CA.   Clinical Impression   Pt admitted with SBO. Pt currently with functional limitations due to the deficits listed below (see OT Problem List).  Pt will benefit from skilled OT to increase their safety and independence with ADL and functional mobility for ADL to facilitate discharge to venue listed below.      Follow Up Recommendations  Supervision/Assistance - 24 hour;Supervision - Intermittent;Home health OT    Equipment Recommendations  None recommended by OT    Recommendations for Other Services       Precautions / Restrictions Precautions Precautions: Anterior Hip;Other (comment)(NG tube) Precaution Comments: Reviewed precautions Restrictions Weight Bearing Restrictions: Yes RLE Weight Bearing: Weight bearing as tolerated(per MD note and pt and wife WBAT, but orders state TDWB, have asked for clarification of order, but treated as WBAT per wife report)      Mobility Bed Mobility Overal bed mobility: Needs Assistance Bed Mobility: Supine to Sit     Supine to sit: Supervision;HOB elevated     General bed mobility comments: Pt able to use L LE to hook under R LE and get to EOB with rail and HOB elevated  Transfers Overall transfer level: Needs assistance Equipment used: Rolling walker (2 wheeled) Transfers: Sit to/from Stand Sit to Stand: Min guard         General transfer comment: cues for safe technique and hand placement    Balance Overall balance assessment: Mild deficits observed, not formally tested                                         ADL either performed or assessed with clinical judgement   ADL Overall ADL's : Needs  assistance/impaired Eating/Feeding: Set up;Sitting   Grooming: Set up;Sitting   Upper Body Bathing: Set up;Sitting   Lower Body Bathing: Moderate assistance;Sit to/from stand;Cueing for sequencing;Cueing for safety   Upper Body Dressing : Set up;Sitting   Lower Body Dressing: Moderate assistance;Sit to/from stand;Cueing for sequencing;Cueing for safety                       Vision Patient Visual Report: No change from baseline              Pertinent Vitals/Pain Pain Assessment: 0-10 Pain Score: 2  Pain Location: abd Pain Descriptors / Indicators: Discomfort Pain Intervention(s): Repositioned;Limited activity within patient's tolerance     Hand Dominance     Extremity/Trunk Assessment Upper Extremity Assessment Upper Extremity Assessment: Overall WFL for tasks assessed   Lower Extremity Assessment Lower Extremity Assessment: RLE deficits/detail RLE Deficits / Details: R hip AROM 60 degrees in supine       Communication Communication Communication: No difficulties   Cognition Arousal/Alertness: Awake/alert Behavior During Therapy: WFL for tasks assessed/performed Overall Cognitive Status: Within Functional Limits for tasks assessed                                                Home Living  Family/patient expects to be discharged to:: Private residence Living Arrangements: Spouse/significant other Available Help at Discharge: Family Type of Home: House Home Access: Stairs to enter Technical brewer of Steps: 3   Home Layout: One level               Home Equipment: Environmental consultant - 2 wheels   Additional Comments: getting a raised commode seat      Prior Functioning/Environment Level of Independence: Independent                 OT Problem List: Decreased strength;Decreased activity tolerance;Decreased knowledge of use of DME or AE      OT Treatment/Interventions: Self-care/ADL training;DME and/or AE  instruction;Patient/family education;Therapeutic activities    OT Goals(Current goals can be found in the care plan section) Acute Rehab OT Goals Patient Stated Goal: go home OT Goal Formulation: With patient Time For Goal Achievement: 01/03/18  OT Frequency: Min 2X/week   Barriers to D/C:               AM-PAC OT "6 Clicks" Daily Activity     Outcome Measure Help from another person eating meals?: None Help from another person taking care of personal grooming?: None Help from another person toileting, which includes using toliet, bedpan, or urinal?: A Lot Help from another person bathing (including washing, rinsing, drying)?: A Little Help from another person to put on and taking off regular upper body clothing?: None Help from another person to put on and taking off regular lower body clothing?: A Lot 6 Click Score: 19   End of Session Equipment Utilized During Treatment: Rolling walker Nurse Communication: Mobility status  Activity Tolerance: Patient limited by fatigue Patient left: in bed;with call bell/phone within reach;with bed alarm set  OT Visit Diagnosis: Unsteadiness on feet (R26.81);Muscle weakness (generalized) (M62.81)                Time: 5621-3086 OT Time Calculation (min): 20 min Charges:  OT General Charges $OT Visit: 1 Visit OT Evaluation $OT Eval Moderate Complexity: 1 Mod  Kari Baars, OT Acute Rehabilitation Services [phone] Office- [phone], Edwena Felty D 12/20/2017, 2:27 PM

## 2017-12-20 NOTE — Progress Notes (Signed)
Patient ID: Richard Whitaker, male   DOB: [birth year], 72 y.o.   MRN: [id number]   Acute Care Surgery Service Progress Note:    Chief Complaint/Subjective: No signif c/o Wife at bs No flatus/BM 1900 cc out of NG - bilious  Objective: Vital signs in last 24 hours: Temp:  [97.8 F (36.6 C)-99.6 F (37.6 C)] 98.5 F (36.9 C) (11/24 0430) Pulse Rate:  [98-108] 101 (11/24 0430) Resp:  [18-20] 20 (11/24 0430) BP: (112-133)/(69-84) 112/76 (11/24 0430) SpO2:  [96 %-99 %] 99 % (11/24 0430) Last BM Date: 12/13/17  Intake/Output from previous day: 11/23 0701 - 11/24 0700 In: 2225.4 [I.V.:2075.4; IV Piggyback:150] Out: 3250 [Urine:1350; Emesis/NG output:1900] Intake/Output this shift: No intake/output data recorded.  Lungs: cta, nonlabored  Cardiovascular: reg  Abd: soft, min distension, NT, old lower scar  Extremities: no edema, +SCDs  Neuro: alert, nonfocal  Lab Results: CBC  Recent Labs    12/19/17 0515 12/20/17 0507  WBC 4.5 4.3  HGB 12.0* 10.8*  HCT 38.6* 35.6*  PLT 272 263   BMET Recent Labs    12/19/17 0515 12/20/17 0507  NA 144 148*  K 3.4* 3.5  CL 104 113*  CO2 31 26  GLUCOSE 114* 95  BUN 21 20  CREATININE 1.04 0.95  CALCIUM 8.7* 8.5*   LFT Hepatic Function Latest Ref Rng & Units 12/18/2017 08/22/2013  Total Protein 6.5 - 8.1 g/dL 7.5 9.0(H)  Albumin 3.5 - 5.0 g/dL 3.5 4.4  AST 15 - 41 U/L 40 33  ALT 0 - 44 U/L 30 41  Alk Phosphatase 38 - 126 U/L 78 158(H)  Total Bilirubin 0.3 - 1.2 mg/dL 1.2 0.7   PT/INR No results for input(s): LABPROT, INR in the last 72 hours. ABG No results for input(s): PHART, HCO3 in the last 72 hours.  Invalid input(s): PCO2, PO2  Studies/Results:  Anti-infectives: Anti-infectives (From admission, onward)   None      Medications: Scheduled Meds: . enoxaparin (LOVENOX) injection  40 mg Subcutaneous Q24H  . insulin aspart  0-15 Units Subcutaneous Q4H  . mouth rinse  15 mL Mouth Rinse BID   Continuous  Infusions: . 0.9 % NaCl with KCl 20 mEq / L 100 mL/hr at 12/20/17 0115  . famotidine (PEPCID) IV Stopped (12/19/17 1606)   PRN Meds:.acetaminophen **OR** acetaminophen, diphenhydrAMINE **OR** diphenhydrAMINE, hydrALAZINE, morphine injection, ondansetron **OR** ondansetron (ZOFRAN) IV  Assessment/Plan: Patient Active Problem List   Diagnosis Date Noted  . SBO (small bowel obstruction) ([place]) 12/18/2017  psbo  S/p Rt THA at [facility] Hypokalemia  HTN DM 2  Xray this am still shows SB dilation; had 1900cc/24hrs bilious NG output, obstipated.  Cont bowel rest, NG decompression, ice chips No indication for surgery today (fever, tachycardia, phys exam) Cont chemical vte prophylaxis BP ok PT consult pending given recent Rt hip surgery  Disposition: looks more like psbo now; re-discussed sbo and mgmt with pt and wife; discussed indications for surgery (worsening picture, failure to progress), answered their questions.    LOS: 2 days    Leighton Ruff. Redmond Pulling, MD, FACS General, Bariatric, & Minimally Invasive Surgery [phone] [facility]

## 2017-12-21 ENCOUNTER — Inpatient Hospital Stay (HOSPITAL_COMMUNITY): Payer: No Typology Code available for payment source

## 2017-12-21 LAB — BASIC METABOLIC PANEL
ANION GAP: 7 (ref 5–15)
BUN: 16 mg/dL (ref 8–23)
CHLORIDE: 111 mmol/L (ref 98–111)
CO2: 27 mmol/L (ref 22–32)
Calcium: 8.1 mg/dL — ABNORMAL LOW (ref 8.9–10.3)
Creatinine, Ser: 0.82 mg/dL (ref 0.61–1.24)
GFR calc non Af Amer: >60 mL/min (ref >60)
Glucose, Bld: 141 mg/dL — ABNORMAL HIGH (ref 70–99)
POTASSIUM: 3.5 mmol/L (ref 3.5–5.1)
SODIUM: 145 mmol/L (ref 135–145)

## 2017-12-21 LAB — GLUCOSE, CAPILLARY
GLUCOSE-CAPILLARY: 110 mg/dL — AB (ref 70–99)
GLUCOSE-CAPILLARY: 113 mg/dL — AB (ref 70–99)
GLUCOSE-CAPILLARY: 124 mg/dL — AB (ref 70–99)
Glucose-Capillary: 115 mg/dL — ABNORMAL HIGH (ref 70–99)
Glucose-Capillary: 132 mg/dL — ABNORMAL HIGH (ref 70–99)
Glucose-Capillary: 130 mg/dL — ABNORMAL HIGH (ref 70–99)

## 2017-12-21 LAB — MAGNESIUM: MAGNESIUM: 1.9 mg/dL (ref 1.7–2.4)

## 2017-12-21 NOTE — Care Management Important Message (Signed)
Important Message  Patient Details  Name: SIRUS LABRIE MRN: [id number] Date of Birth: 10-11-1945   Medicare Important Message Given:  Yes    Kerin Salen 12/21/2017, 11:21 AMImportant Message  Patient Details  Name: SPYROS WINCH MRN: [id number] Date of Birth: [birth year]   Medicare Important Message Given:  Yes    Kerin Salen 12/21/2017, 11:21 AM

## 2017-12-21 NOTE — Progress Notes (Signed)
PT Cancellation Note  Patient Details Name: Richard Whitaker MRN: [id number] DOB: 1946-01-27   Cancelled Treatment:     room dark, pt sleeping.  Family member in room stated pt was up earlier and in recliner most of the day.  "We were expecting you this morning".  "Can you you come back tomorrow?".   Will attempt to see pt Tuesday around 11 am pere family request.    Rica Koyanagi  PTA Acute  Rehabilitation Services Pager      [phone] Office      [phone]

## 2017-12-21 NOTE — Progress Notes (Addendum)
[facility] Progress Note     Subjective: CC-  Patient states that he feels about the same. Continues to have som lower abdominal pain. Denies any n/v. No flatus or BM. NG tube output down 650cc/24hr. Xray today shows contrast in colon but some persistently dilated loops of small bowel.  Objective: Vital signs in last 24 hours: Temp:  [98.2 F (36.8 C)-99.3 F (37.4 C)] 98.4 F (36.9 C) (11/25 0417) Pulse Rate:  [90-104] 90 (11/25 0417) Resp:  [16-20] 16 (11/25 0417) BP: (132-140)/(74-85) 133/74 (11/25 0417) SpO2:  [97 %-99 %] 98 % (11/25 0417) Last BM Date: 12/13/17  Intake/Output from previous day: 11/24 0701 - 11/25 0700 In: 888.1 [I.V.:842.5; IV Piggyback:45.6] Out: 1250 [Urine:600; Emesis/NG output:650] Intake/Output this shift: No intake/output data recorded.  PE: Gen:  Alert, NAD, pleasant HEENT: EOM's intact, pupils equal and round Card:  2+ DP pulses Pulm:  effort normal Abd: Soft, mild distension, few BS heard, mild lower abdominal TTP without rebound or guarding Ext:  No edema BLE Psych: A&Ox3  Skin: no rashes noted, warm and dry  Lab Results:  Recent Labs    12/19/17 0515 12/20/17 0507  WBC 4.5 4.3  HGB 12.0* 10.8*  HCT 38.6* 35.6*  PLT 272 263   BMET Recent Labs    12/20/17 0507 12/21/17 0514  NA 148* 145  K 3.5 3.5  CL 113* 111  CO2 26 27  GLUCOSE 95 141*  BUN 20 16  CREATININE 0.95 0.82  CALCIUM 8.5* 8.1*   PT/INR No results for input(s): LABPROT, INR in the last 72 hours. CMP     Component Value Date/Time   NA 145 12/21/2017 0514   K 3.5 12/21/2017 0514   CL 111 12/21/2017 0514   CO2 27 12/21/2017 0514   GLUCOSE 141 (H) 12/21/2017 0514   BUN 16 12/21/2017 0514   CREATININE 0.82 12/21/2017 0514   CALCIUM 8.1 (L) 12/21/2017 0514   PROT 7.5 12/18/2017 0937   ALBUMIN 3.5 12/18/2017 0937   AST 40 12/18/2017 0937   ALT 30 12/18/2017 0937   ALKPHOS 78 12/18/2017 0937   BILITOT 1.2 12/18/2017 0937   GFRNONAA >60  12/21/2017 0514   GFRAA >60 12/21/2017 0514   Lipase     Component Value Date/Time   LIPASE 23 12/18/2017 0937       Studies/Results: Dg Abd Portable 1v  Result Date: 12/21/2017 CLINICAL DATA:  Small bowel obstruction. EXAM: PORTABLE ABDOMEN - 1 VIEW COMPARISON:  Radiograph 12/19/2017, CT 12/18/2017 FINDINGS: Enteric contrast within the ascending, transverse, and likely distal transverse colon. Persistent gaseous distention of small bowel in the central abdomen, greatest small bowel dimension 4.1 cm, equivocal slight improvement from prior. Enteric tube in the stomach is partially included. IMPRESSION: Persistent gaseous distention of central small bowel, however administered enteric contrast has reached the colon. Findings suggesting partial rather than complete small bowel obstruction. Electronically Signed   By: Keith Rake M.D.   On: 12/21/2017 06:27   Dg Abd Portable 1v  Result Date: 12/20/2017 CLINICAL DATA:  8 hour delay. Small-bowel protocol. EXAM: PORTABLE ABDOMEN - 1 VIEW COMPARISON:  12/19/2017 FINDINGS: No enteric contrast material is demonstrated within the abdomen. Multiple dilated gas-filled central loops of small bowel consistent with small bowel obstruction. Scattered gas and stool in the colon without distention. No radiopaque stones. Degenerative changes in the spine and left hip. Prior right hip arthroplasty. IMPRESSION: Dilated gas-filled central loops of small bowel consistent with small bowel obstruction. No enteric contrast  material is visualized in the abdomen. Electronically Signed   By: Lucienne Capers M.D.   On: 12/20/2017 01:12    Anti-infectives: Anti-infectives (From admission, onward)   None       Assessment/Plan HTN DM2 - SSI S/p R THA at [facility] 12/14/17  SBO - prior h/o open prostatectomy - CT 11/22 showed numerous dilated loops of small bowel measuring up to 3.7 cm with a relative transition point in the distal ileum in the lower  mid abdomen most consistent with a bowel obstruction  ID - none FEN - IVF, NPO/NGT VTE - SCDs, lovenox Foley - none  Plan - Xray does show contrast in colon, but patient has no signs of bowel function yet. Continue NPO/NG tube to LIWS. Ambulate. Continue PT/OT. Repeat abdominal xray in AM.   LOS: 3 days    Wellington Hampshire , [facility] Surgery 12/21/2017, 8:26 AM Pager: [phone]  Agree with above. Contrast in colon on today's KUB (interestingly, the contrast was not seen on prior films), but clinically he is about the same. He was admitted 12/18/2017 - so if no more progress in the next day or two, he may need exploration.  Alphonsa Overall, MD, [facility] Surgery Pager: [phone] Office phone:  [phone]

## 2017-12-22 ENCOUNTER — Inpatient Hospital Stay (HOSPITAL_COMMUNITY): Payer: No Typology Code available for payment source

## 2017-12-22 LAB — CBC
HEMATOCRIT: 32.7 % — AB (ref 39.0–52.0)
HEMOGLOBIN: 10.2 g/dL — AB (ref 13.0–17.0)
MCH: 25.9 pg — ABNORMAL LOW (ref 26.0–34.0)
MCHC: 31.2 g/dL (ref 30.0–36.0)
MCV: 83 fL (ref 80.0–100.0)
NRBC: 0 % (ref 0.0–0.2)
Platelets: 296 10*3/uL (ref 150–400)
RBC: 3.94 MIL/uL — ABNORMAL LOW (ref 4.22–5.81)
RDW: 14.1 % (ref 11.5–15.5)
WBC: 6.7 10*3/uL (ref 4.0–10.5)

## 2017-12-22 LAB — GLUCOSE, CAPILLARY
GLUCOSE-CAPILLARY: 108 mg/dL — AB (ref 70–99)
GLUCOSE-CAPILLARY: 111 mg/dL — AB (ref 70–99)
GLUCOSE-CAPILLARY: 126 mg/dL — AB (ref 70–99)
Glucose-Capillary: 122 mg/dL — ABNORMAL HIGH (ref 70–99)
Glucose-Capillary: 142 mg/dL — ABNORMAL HIGH (ref 70–99)
Glucose-Capillary: 125 mg/dL — ABNORMAL HIGH (ref 70–99)

## 2017-12-22 LAB — BASIC METABOLIC PANEL
ANION GAP: 9 (ref 5–15)
BUN: 11 mg/dL (ref 8–23)
CALCIUM: 8.3 mg/dL — AB (ref 8.9–10.3)
CO2: 24 mmol/L (ref 22–32)
Chloride: 112 mmol/L — ABNORMAL HIGH (ref 98–111)
Creatinine, Ser: 0.8 mg/dL (ref 0.61–1.24)
GFR calc non Af Amer: >60 mL/min (ref >60)
Glucose, Bld: 129 mg/dL — ABNORMAL HIGH (ref 70–99)
Potassium: 3.5 mmol/L (ref 3.5–5.1)
Sodium: 145 mmol/L (ref 135–145)

## 2017-12-22 LAB — MAGNESIUM: Magnesium: 1.7 mg/dL (ref 1.7–2.4)

## 2017-12-22 MED ORDER — PHENOL 1.4 % MT LIQD
1.0000 | OROMUCOSAL | Status: DC | PRN
  Filled 2017-12-22: qty 177

## 2017-12-22 NOTE — Progress Notes (Addendum)
[facility] Progress Note     Subjective: CC-  Patient states that he had a large BM yesterday and continues to pass some flatus this morning. Denies any abdominal pain, nausea, or vomiting.  NG tube output down to 225cc/24hr.  Xray today looks about the same with contrast in colon but some persistently dilated loops of small bowel.  Objective: Vital signs in last 24 hours: Temp:  [98.7 F (37.1 C)-99.3 F (37.4 C)] 99.1 F (37.3 C) (11/26 0444) Pulse Rate:  [84-95] 84 (11/26 0444) Resp:  [16-17] 17 (11/26 0444) BP: (135-145)/(82-90) 144/82 (11/26 0444) SpO2:  [97 %-98 %] 98 % (11/26 0444) Last BM Date: 12/21/17  Intake/Output from previous day: 11/25 0701 - 11/26 0700 In: -  Out: 325 [Urine:100; Emesis/NG output:225] Intake/Output this shift: No intake/output data recorded.  PE: Gen:  Alert, NAD, pleasant HEENT: EOM's intact, pupils equal and round Card:  2+ DP pulses Pulm:  effort normal Abd: Soft, mild distension, + BS, mild LLQ TTP without rebound or guarding Ext:  No edema BLE Psych: A&Ox3  Skin: no rashes noted, warm and dry  Lab Results:  Recent Labs    12/20/17 0507 12/22/17 0404  WBC 4.3 6.7  HGB 10.8* 10.2*  HCT 35.6* 32.7*  PLT 263 296   BMET Recent Labs    12/21/17 0514 12/22/17 0404  NA 145 145  K 3.5 3.5  CL 111 112*  CO2 27 24  GLUCOSE 141* 129*  BUN 16 11  CREATININE 0.82 0.80  CALCIUM 8.1* 8.3*   PT/INR No results for input(s): LABPROT, INR in the last 72 hours. CMP     Component Value Date/Time   NA 145 12/22/2017 0404   K 3.5 12/22/2017 0404   CL 112 (H) 12/22/2017 0404   CO2 24 12/22/2017 0404   GLUCOSE 129 (H) 12/22/2017 0404   BUN 11 12/22/2017 0404   CREATININE 0.80 12/22/2017 0404   CALCIUM 8.3 (L) 12/22/2017 0404   PROT 7.5 12/18/2017 0937   ALBUMIN 3.5 12/18/2017 0937   AST 40 12/18/2017 0937   ALT 30 12/18/2017 0937   ALKPHOS 78 12/18/2017 0937   BILITOT 1.2 12/18/2017 0937   GFRNONAA >60  12/22/2017 0404   GFRAA >60 12/22/2017 0404   Lipase     Component Value Date/Time   LIPASE 23 12/18/2017 0937       Studies/Results: Dg Abd Portable 1v  Result Date: 12/22/2017 CLINICAL DATA:  Follow up small bowel obstruction EXAM: PORTABLE ABDOMEN - 1 VIEW COMPARISON:  12/21/2017 FINDINGS: Nasogastric catheter is noted coiled within the stomach. Contrast material is again seen throughout the colon stable from the previous exam. Persistent small bowel dilatation is noted measuring up to 4.9 cm in diameter. No new focal abnormality is seen. No free air is noted. Prior left hip replacement is seen. Degenerative change of the lumbar spine is noted. IMPRESSION: Stable small bowel obstructive changes. Contrast material is again noted within the colon. Electronically Signed   By: Inez Catalina M.D.   On: 12/22/2017 07:06   Dg Abd Portable 1v  Result Date: 12/21/2017 CLINICAL DATA:  Small bowel obstruction. EXAM: PORTABLE ABDOMEN - 1 VIEW COMPARISON:  Radiograph 12/19/2017, CT 12/18/2017 FINDINGS: Enteric contrast within the ascending, transverse, and likely distal transverse colon. Persistent gaseous distention of small bowel in the central abdomen, greatest small bowel dimension 4.1 cm, equivocal slight improvement from prior. Enteric tube in the stomach is partially included. IMPRESSION: Persistent gaseous distention of central small bowel,  however administered enteric contrast has reached the colon. Findings suggesting partial rather than complete small bowel obstruction. Electronically Signed   By: Keith Rake M.D.   On: 12/21/2017 06:27    Anti-infectives: Anti-infectives (From admission, onward)   None       Assessment/Plan HTN DM2 - SSI S/p R THA at [facility] 12/14/17  SBO - prior h/o open prostatectomy - CT 11/22 showed numerous dilated loops of small bowel measuring up to 3.7 cm with a relative transition point in the distal ileum in the lower mid abdomen most  consistent with a bowel obstruction  ID - none FEN - IVF,clamp NG tube, sips of clears VTE - SCDs, lovenox Foley - none  Plan - Patient now having bowel function and no abdominal pain. Xray does show some persistently dilated loops of small bowel.   Clamp NG tube and allow sips of clears from the floor.    LOS: 4 days    Wellington Hampshire , [facility] Surgery 12/22/2017, 8:02 AM Pager: [phone]  Agree with above. Seems to be doing better.  Had a BM. Will clamp NGT overnight and reassess in the AM.  Alphonsa Overall, MD, [facility] Surgery Pager: [phone] Office phone:  [phone]

## 2017-12-22 NOTE — Progress Notes (Signed)
Mr Richard Whitaker c/o of a sore throat from the NG tube. I have spoken with [facility]. She will order chloroseptic spray

## 2017-12-22 NOTE — Progress Notes (Signed)
Physical Therapy Treatment Patient Details Name: Richard Whitaker MRN: [id number] DOB: 06-14-1945 Today's Date: 12/22/2017    History of Present Illness 72 year old male s/p right THA with a Dr. Quay Burow (Ortho) at the [facility] on 12/14/17.  Once home he devopled SBO.  PMH includes prostate CA.    PT Comments    Pt OOB in recliner with NG tube clamped.  Assisted to bathroom and pt had an explosive watery stool.  Assisted with amb in hallway.  Pt c/o "a lot" ABD pain which he describes as "waves", "cramping" Pt highly motivated to continue walking to get better.  Pt also c/o "mild" hip "stiffness" which decreased with mobility.  Pt progressing well.    Follow Up Recommendations  Home health PT     Equipment Recommendations  None recommended by PT    Recommendations for Other Services       Precautions / Restrictions Precautions Precautions: Anterior Hip;Other (comment) Precaution Comments: NG clamped Restrictions Weight Bearing Restrictions: No RLE Weight Bearing: Weight bearing as tolerated    Mobility  Bed Mobility Overal bed mobility: Needs Assistance Bed Mobility: Supine to Sit     Supine to sit: Supervision;HOB elevated     General bed mobility comments: OOB in recliner   Transfers Overall transfer level: Needs assistance Equipment used: Rolling walker (2 wheeled) Transfers: Sit to/from [facility] Sit to Stand: Min guard;Supervision Stand pivot transfers: Supervision;Min guard       General transfer comment: 25% VC's safety with turns and hand plaxcement with stand to sit to control decend   Ambulation/Gait Ambulation/Gait assistance: Min guard;Supervision Gait Distance (Feet): 325 Feet Assistive device: Rolling walker (2 wheeled) Gait Pattern/deviations: Step-through pattern Gait velocity: decreased   General Gait Details: good safety cognition and slight lean on walker due to recent THR.  Tolerated distance well.  Increased time  due to ABD pain/disconfort/waves of cramping   Stairs             Wheelchair Mobility    Modified Rankin (Stroke Patients Only)       Balance Overall balance assessment: Mild deficits observed, not formally tested                                          Cognition Arousal/Alertness: Awake/alert Behavior During Therapy: WFL for tasks assessed/performed Overall Cognitive Status: Within Functional Limits for tasks assessed                                 General Comments: very motivated       Exercises      General Comments        Pertinent Vitals/Pain Pain Assessment: Faces Pain Score: 3  Faces Pain Scale: Hurts even more Pain Location: ABD Pain Descriptors / Indicators: Discomfort;Cramping Pain Intervention(s): Monitored during session    Home Living                      Prior Function            PT Goals (current goals can now be found in the care plan section) Progress towards PT goals: Progressing toward goals    Frequency    Min 3X/week      PT Plan Current plan remains appropriate    Co-evaluation  AM-PAC PT "6 Clicks" Mobility   Outcome Measure  Help needed turning from your back to your side while in a flat bed without using bedrails?: A Little Help needed moving from lying on your back to sitting on the side of a flat bed without using bedrails?: A Little Help needed moving to and from a bed to a chair (including a wheelchair)?: A Little Help needed standing up from a chair using your arms (e.g., wheelchair or bedside chair)?: A Little Help needed to walk in hospital room?: A Little Help needed climbing 3-5 steps with a railing? : A Little 6 Click Score: 18    End of Session Equipment Utilized During Treatment: Gait belt Activity Tolerance: No increased pain Patient left: in chair;with call bell/phone within reach Nurse Communication: Mobility status(one watery stool in  toiolet) PT Visit Diagnosis: Difficulty in walking, not elsewhere classified (R26.2);Muscle weakness (generalized) (M62.81)     Time: 4010-2725 PT Time Calculation (min) (ACUTE ONLY): 28 min  Charges:  $Gait Training: 8-22 mins $Therapeutic Activity: 8-22 mins                     Rica Koyanagi  PTA Acute  Rehabilitation Services Pager      [phone] Office      [phone]

## 2017-12-22 NOTE — Progress Notes (Signed)
Occupational Therapy Treatment Patient Details Name: Richard Whitaker MRN: [id number] DOB: 05/12/1945 Today's Date: 12/22/2017    History of present illness 72 year old male s/p right THA with a Dr. Quay Burow (Ortho) at the [facility] on 12/14/17.  Once home he devopled SBO.  PMH includes prostate CA.   OT comments  OT explained benefits of being OOB and eating an upright position.  Upon sitting pt had a HUGE burp.  Follow Up Recommendations  Supervision/Assistance - 24 hour;Supervision - Intermittent;Home health OT    Equipment Recommendations  None recommended by OT    Recommendations for Other Services      Precautions / Restrictions Precautions Precautions: Anterior Hip;Other (comment) Precaution Comments: NG clamped Restrictions Weight Bearing Restrictions: No RLE Weight Bearing: Weight bearing as tolerated       Mobility Bed Mobility Overal bed mobility: Needs Assistance Bed Mobility: Supine to Sit     Supine to sit: Supervision;HOB elevated     General bed mobility comments: Pt able to use L LE to hook under R LE and get to EOB with rail and HOB elevated  Transfers Overall transfer level: Needs assistance Equipment used: Rolling walker (2 wheeled) Transfers: Sit to/from [facility] Sit to Stand: Min guard Stand pivot transfers: Min guard       General transfer comment: cues for safe technique and hand placement    Balance Overall balance assessment: Mild deficits observed, not formally tested                                         ADL either performed or assessed with clinical judgement   ADL Overall ADL's : Needs assistance/impaired Eating/Feeding: Set up;Sitting   Grooming: Set up;Sitting       Lower Body Bathing: Sit to/from stand Lower Body Bathing Details (indicate cue type and reason): pt state wife will A     Lower Body Dressing: Moderate assistance;Sit to/from stand;Cueing for sequencing;Cueing for  safety Lower Body Dressing Details (indicate cue type and reason): states wife will A Toilet Transfer: Minimal assistance;RW Toilet Transfer Details (indicate cue type and reason): bed to chair           General ADL Comments: educated on importance of being OOB.  Pt needed encouagement and became agitated with OT in regards to getting OOB. RN aware.       Vision Patient Visual Report: No change from baseline     Perception     Praxis      Cognition Arousal/Alertness: Awake/alert Behavior During Therapy: WFL for tasks assessed/performed Overall Cognitive Status: Within Functional Limits for tasks assessed                                                     Pertinent Vitals/ Pain       Pain Assessment: No/denies pain Pain Score: 3  Pain Location: ABD Pain Descriptors / Indicators: Discomfort Pain Intervention(s): Limited activity within patient's tolerance         Frequency  Min 2X/week        Progress Toward Goals  OT Goals(current goals can now be found in the care plan section)  Progress towards OT goals: Progressing toward goals     Plan Discharge  plan remains appropriate       AM-PAC OT "6 Clicks" Daily Activity     Outcome Measure   Help from another person eating meals?: None Help from another person taking care of personal grooming?: None Help from another person toileting, which includes using toliet, bedpan, or urinal?: A Lot Help from another person bathing (including washing, rinsing, drying)?: A Little Help from another person to put on and taking off regular upper body clothing?: None Help from another person to put on and taking off regular lower body clothing?: A Lot 6 Click Score: 19    End of Session Equipment Utilized During Treatment: Rolling walker  OT Visit Diagnosis: Unsteadiness on feet (R26.81);Muscle weakness (generalized) (M62.81)   Activity Tolerance Patient tolerated treatment well   Patient Left  with call bell/phone within reach;in chair   Nurse Communication Mobility status        Time: 1000-1024 OT Time Calculation (min): 24 min  Charges: OT General Charges $OT Visit: 1 Visit OT Evaluation $OT Eval Moderate Complexity: 1 Mod OT Treatments $Self Care/Home Management : 8-22 mins  Kari Baars, OT Acute Rehabilitation Services [phone] Office- Dawson, Edwena Felty D 12/22/2017, 3:05 PM

## 2017-12-22 NOTE — Progress Notes (Addendum)
Called to [place] Mr Richard Whitaker awoke , he stated that he  felt that the NG tube was in his throat.He started choking and vomiting.  He vomited approx 800 mlof yellow / orange bile. The NG tube was  removed His lungs sounds are clear diminished. Writer has called the oncall service for [facility].   1704.Waiting for a call from [place]. HR 92 , RESP 20,  BP 125/92.R -20. Pt voices no complaints.     1920 I have spoken to Dr Harlow Asa. He advised to leave the NG TUBE out and he stated he appreciated the call

## 2017-12-23 ENCOUNTER — Inpatient Hospital Stay (HOSPITAL_COMMUNITY): Payer: No Typology Code available for payment source

## 2017-12-23 LAB — CBC
HCT: 33.8 % — ABNORMAL LOW (ref 39.0–52.0)
HEMOGLOBIN: 10.7 g/dL — AB (ref 13.0–17.0)
MCH: 26 pg (ref 26.0–34.0)
MCHC: 31.7 g/dL (ref 30.0–36.0)
MCV: 82.2 fL (ref 80.0–100.0)
PLATELETS: 316 10*3/uL (ref 150–400)
RBC: 4.11 MIL/uL — ABNORMAL LOW (ref 4.22–5.81)
RDW: 14 % (ref 11.5–15.5)
WBC: 7.2 10*3/uL (ref 4.0–10.5)
nRBC: 0 % (ref 0.0–0.2)

## 2017-12-23 LAB — BASIC METABOLIC PANEL
Anion gap: 6 (ref 5–15)
BUN: 9 mg/dL (ref 8–23)
CALCIUM: 8.4 mg/dL — AB (ref 8.9–10.3)
CHLORIDE: 107 mmol/L (ref 98–111)
CO2: 27 mmol/L (ref 22–32)
CREATININE: 0.74 mg/dL (ref 0.61–1.24)
Glucose, Bld: 126 mg/dL — ABNORMAL HIGH (ref 70–99)
Potassium: 3.4 mmol/L — ABNORMAL LOW (ref 3.5–5.1)
SODIUM: 140 mmol/L (ref 135–145)

## 2017-12-23 LAB — GLUCOSE, CAPILLARY
GLUCOSE-CAPILLARY: 151 mg/dL — AB (ref 70–99)
GLUCOSE-CAPILLARY: 191 mg/dL — AB (ref 70–99)
Glucose-Capillary: 134 mg/dL — ABNORMAL HIGH (ref 70–99)
Glucose-Capillary: 122 mg/dL — ABNORMAL HIGH (ref 70–99)
Glucose-Capillary: 106 mg/dL — ABNORMAL HIGH (ref 70–99)
Glucose-Capillary: 107 mg/dL — ABNORMAL HIGH (ref 70–99)

## 2017-12-23 MED ORDER — POTASSIUM CHLORIDE CRYS ER 20 MEQ PO TBCR
20.0000 meq | EXTENDED_RELEASE_TABLET | Freq: Two times a day (BID) | ORAL | Status: AC
  Administered 2017-12-23 (×2): 20 meq via ORAL
  Filled 2017-12-23 (×2): qty 1

## 2017-12-23 NOTE — Progress Notes (Addendum)
[facility] Progress Note     Subjective: CC-  Patient states that the NG tube was irritating his throat last night which caused him to vomit, NG tube came out. States that he drank water, G[place]ale, and ate Jello yesterday. He had 2 BMs yesterday and 1 today. Passing flatus this morning. Still feels bloated and he reports some lower abdominal pain last night, but he denies any abdominal pain this morning.  Objective: Vital signs in last 24 hours: Temp:  [98.3 F (36.8 C)-99 F (37.2 C)] 99 F (37.2 C) (11/27 0441) Pulse Rate:  [85-92] 87 (11/27 0441) Resp:  [18-20] 18 (11/27 0441) BP: (125-150)/(83-90) 141/88 (11/27 0441) SpO2:  [94 %-98 %] 98 % (11/27 0441) Last BM Date: 12/21/17  Intake/Output from previous day: No intake/output data recorded. Intake/Output this shift: No intake/output data recorded.  PE: Gen: Alert, NAD, pleasant HEENT: EOM's intact, pupils equal and round Card:2+ DP pulses Pulm: effort normal Abd: Soft,mild distension,+BS,nontender Ext:No edema BLE Psych: A&Ox3  Skin: no rashes noted, warm and dry  Lab Results:  Recent Labs    12/22/17 0404 12/23/17 0338  WBC 6.7 7.2  HGB 10.2* 10.7*  HCT 32.7* 33.8*  PLT 296 316   BMET Recent Labs    12/22/17 0404 12/23/17 0338  NA 145 140  K 3.5 3.4*  CL 112* 107  CO2 24 27  GLUCOSE 129* 126*  BUN 11 9  CREATININE 0.80 0.74  CALCIUM 8.3* 8.4*   PT/INR No results for input(s): LABPROT, INR in the last 72 hours. CMP     Component Value Date/Time   NA 140 12/23/2017 0338   K 3.4 (L) 12/23/2017 0338   CL 107 12/23/2017 0338   CO2 27 12/23/2017 0338   GLUCOSE 126 (H) 12/23/2017 0338   BUN 9 12/23/2017 0338   CREATININE 0.74 12/23/2017 0338   CALCIUM 8.4 (L) 12/23/2017 0338   PROT 7.5 12/18/2017 0937   ALBUMIN 3.5 12/18/2017 0937   AST 40 12/18/2017 0937   ALT 30 12/18/2017 0937   ALKPHOS 78 12/18/2017 0937   BILITOT 1.2 12/18/2017 0937   GFRNONAA >60 12/23/2017  0338   GFRAA >60 12/23/2017 0338   Lipase     Component Value Date/Time   LIPASE 23 12/18/2017 0937       Studies/Results: Dg Abd Portable 1v  Result Date: 12/22/2017 CLINICAL DATA:  Follow up small bowel obstruction EXAM: PORTABLE ABDOMEN - 1 VIEW COMPARISON:  12/21/2017 FINDINGS: Nasogastric catheter is noted coiled within the stomach. Contrast material is again seen throughout the colon stable from the previous exam. Persistent small bowel dilatation is noted measuring up to 4.9 cm in diameter. No new focal abnormality is seen. No free air is noted. Prior left hip replacement is seen. Degenerative change of the lumbar spine is noted. IMPRESSION: Stable small bowel obstructive changes. Contrast material is again noted within the colon. Electronically Signed   By: Inez Catalina M.D.   On: 12/22/2017 07:06    Anti-infectives: Anti-infectives (From admission, onward)   None       Assessment/Plan HTN DM2 - SSI S/p R THA at [facility] 12/14/17  SBO - prior h/o open prostatectomy - CT 11/22 showed numerous dilated loops of small bowel measuring up to 3.7 cm with a relative transition point in the distal ileum in the lower mid abdomen most consistent with a bowel obstruction  ID -none FEN -IVF, NPO VTE -SCDs, lovenox Foley -none  Plan- Patient having bowel function and  no abdominal pain, but he did vomit once yesterday and xray shows some persistently dilated loops of small bowel.   Keep NPO until patient is seen by Dr. Lucia Gaskins.   LOS: 5 days    Wellington Hampshire , [facility] 12/23/2017, 7:49 AM Pager: [phone]  Agree with above. His wife is in the room.  I had a lengthy discussion about further observation vs surgery today.  He is now 5 days into this hospitalization.  He has had several BM's, in fact, he feels like he has to have a BM now.  He has minimal soreness in his RLQ.  He has active BS.  But his KUB still shows dilated SB loops.   And he vomited last PM and his NGT came out when he vomited.  He wants to hold off on surgery for now.  We will continue clear liquids and not replace NGT. Either he will continue to improve or get worse.  Will check KUB in AM.  Alphonsa Overall, MD, [facility] Surgery Pager: [phone] Office phone:  [phone]

## 2017-12-24 ENCOUNTER — Inpatient Hospital Stay (HOSPITAL_COMMUNITY): Payer: No Typology Code available for payment source

## 2017-12-24 LAB — BASIC METABOLIC PANEL
Anion gap: 6 (ref 5–15)
BUN: 10 mg/dL (ref 8–23)
CALCIUM: 8.3 mg/dL — AB (ref 8.9–10.3)
CO2: 25 mmol/L (ref 22–32)
Chloride: 109 mmol/L (ref 98–111)
Creatinine, Ser: 0.79 mg/dL (ref 0.61–1.24)
GFR calc Af Amer: >60 mL/min (ref >60)
GLUCOSE: 125 mg/dL — AB (ref 70–99)
Potassium: 3.8 mmol/L (ref 3.5–5.1)
Sodium: 140 mmol/L (ref 135–145)

## 2017-12-24 LAB — GLUCOSE, CAPILLARY
GLUCOSE-CAPILLARY: 104 mg/dL — AB (ref 70–99)
Glucose-Capillary: 111 mg/dL — ABNORMAL HIGH (ref 70–99)
Glucose-Capillary: 117 mg/dL — ABNORMAL HIGH (ref 70–99)
Glucose-Capillary: 86 mg/dL (ref 70–99)
Glucose-Capillary: 90 mg/dL (ref 70–99)
Glucose-Capillary: 96 mg/dL (ref 70–99)
Glucose-Capillary: 96 mg/dL (ref 70–99)

## 2017-12-24 LAB — CBC
HEMATOCRIT: 32.6 % — AB (ref 39.0–52.0)
Hemoglobin: 10.1 g/dL — ABNORMAL LOW (ref 13.0–17.0)
MCH: 25.6 pg — AB (ref 26.0–34.0)
MCHC: 31 g/dL (ref 30.0–36.0)
MCV: 82.7 fL (ref 80.0–100.0)
PLATELETS: 323 10*3/uL (ref 150–400)
RBC: 3.94 MIL/uL — ABNORMAL LOW (ref 4.22–5.81)
RDW: 14 % (ref 11.5–15.5)
WBC: 6.6 10*3/uL (ref 4.0–10.5)
nRBC: 0 % (ref 0.0–0.2)

## 2017-12-24 LAB — MAGNESIUM: Magnesium: 1.5 mg/dL — ABNORMAL LOW (ref 1.7–2.4)

## 2017-12-24 NOTE — Progress Notes (Signed)
  [facility] Progress Note     Subjective: CC-  passing flatus and having BM.  Had one episode of nausea yesterday.  Otherwise tolerating clears  Objective: Vital signs in last 24 hours: Temp:  [97.8 F (36.6 C)-98.5 F (36.9 C)] 98.5 F (36.9 C) (11/28 0435) Pulse Rate:  [81-95] 82 (11/28 0435) Resp:  [16-20] 16 (11/28 0435) BP: (128-162)/(78-97) 128/78 (11/28 0435) SpO2:  [96 %-99 %] 99 % (11/28 0435) Last BM Date: 12/23/17  Intake/Output from previous day: 11/27 0701 - 11/28 0700 In: 2748.8 [P.O.:240; IV Piggyback:2508.8] Out: -  Intake/Output this shift: No intake/output data recorded.  PE: Gen: Alert, NAD, pleasant HEENT: EOM's intact, pupils equal and round Card:2+ DP pulses Pulm: effort normal Abd: Soft,mild distension,nontender Ext:No edema BLE Psych: A&Ox3  Skin: no rashes noted, warm and dry  Lab Results:  Recent Labs    12/23/17 0338 12/24/17 0359  WBC 7.2 6.6  HGB 10.7* 10.1*  HCT 33.8* 32.6*  PLT 316 323   BMET Recent Labs    12/23/17 0338 12/24/17 0359  NA 140 140  K 3.4* 3.8  CL 107 109  CO2 27 25  GLUCOSE 126* 125*  BUN 9 10  CREATININE 0.74 0.79  CALCIUM 8.4* 8.3*   PT/INR No results for input(s): LABPROT, INR in the last 72 hours. CMP     Component Value Date/Time   NA 140 12/24/2017 0359   K 3.8 12/24/2017 0359   CL 109 12/24/2017 0359   CO2 25 12/24/2017 0359   GLUCOSE 125 (H) 12/24/2017 0359   BUN 10 12/24/2017 0359   CREATININE 0.79 12/24/2017 0359   CALCIUM 8.3 (L) 12/24/2017 0359   PROT 7.5 12/18/2017 0937   ALBUMIN 3.5 12/18/2017 0937   AST 40 12/18/2017 0937   ALT 30 12/18/2017 0937   ALKPHOS 78 12/18/2017 0937   BILITOT 1.2 12/18/2017 0937   GFRNONAA >60 12/24/2017 0359   GFRAA >60 12/24/2017 0359   Lipase     Component Value Date/Time   LIPASE 23 12/18/2017 0937       Studies/Results: Dg Abd Portable 1v  Result Date: 12/23/2017 CLINICAL DATA:  Follow up small bowel  obstruction EXAM: PORTABLE ABDOMEN - 1 VIEW COMPARISON:  Film from the previous day FINDINGS: Previously seen colonic contrast has passed. Persistent small bowel dilatation is noted without evidence of free air. Nasogastric catheter is not as well appreciated. Right hip replacement is seen. IMPRESSION: Overall stable small bowel dilatation Electronically Signed   By: Inez Catalina M.D.   On: 12/23/2017 09:01    Anti-infectives: Anti-infectives (From admission, onward)   None       Assessment/Plan HTN DM2 - SSI S/p R THA at [facility] 12/14/17  SBO - prior h/o open prostatectomy - CT 11/22 showed numerous dilated loops of small bowel measuring up to 3.7 cm with a relative transition point in the distal ileum in the lower mid abdomen most consistent with a bowel obstruction AXR today shows some mild improvement in SB distention and more air in colon ID -none FEN -IVF, NPO VTE -SCDs, lovenox Foley -none  Plan- Patient having bowel function and no abdominal pain, XR appears to be improving slowly.  Will try full liquids today.  Recheck AXR in AM    LOS: 6 days   Rosario Adie, MD  [facility]

## 2017-12-25 ENCOUNTER — Inpatient Hospital Stay (HOSPITAL_COMMUNITY): Payer: No Typology Code available for payment source

## 2017-12-25 LAB — CREATININE, SERUM
Creatinine, Ser: 0.72 mg/dL (ref 0.61–1.24)
GFR calc Af Amer: >60 mL/min (ref >60)
GFR calc non Af Amer: >60 mL/min (ref >60)

## 2017-12-25 LAB — GLUCOSE, CAPILLARY
GLUCOSE-CAPILLARY: 92 mg/dL (ref 70–99)
Glucose-Capillary: 100 mg/dL — ABNORMAL HIGH (ref 70–99)
Glucose-Capillary: 104 mg/dL — ABNORMAL HIGH (ref 70–99)
Glucose-Capillary: 108 mg/dL — ABNORMAL HIGH (ref 70–99)
Glucose-Capillary: 94 mg/dL (ref 70–99)
Glucose-Capillary: 94 mg/dL (ref 70–99)

## 2017-12-25 MED ORDER — FAMOTIDINE 20 MG PO TABS
20.0000 mg | ORAL_TABLET | Freq: Every day | ORAL | Status: DC
  Administered 2017-12-25: 20 mg via ORAL
  Filled 2017-12-25: qty 1

## 2017-12-25 NOTE — Progress Notes (Signed)
Patient ambulated with front wheel walker up and down the hall with wife, tolerated well. Needed pain medication after each ambulation.

## 2017-12-25 NOTE — Progress Notes (Signed)
OT Cancellation Note  Patient Details Name: Richard Whitaker MRN: [id number] DOB: 12-26-1945   Cancelled Treatment:    Reason Eval/Treat Not Completed: Other (comment).  Pt did not want to get OOB this pm.  States he will get up again when his wife is here as she has been helping him with adls. Pt reports he follows posterior THPs.  , 12/25/2017, 2:30 PM  Lesle Chris, OTR/L Acute Rehabilitation Services [phone] [facility] pager [phone] office 12/25/2017

## 2017-12-25 NOTE — Progress Notes (Signed)
Physical Therapy Treatment Patient Details Name: Richard Whitaker MRN: [id number] DOB: January 07, 1946 Today's Date: 12/25/2017    History of Present Illness 72 year old male s/p right THA with a Dr. Quay Burow (Ortho) at [facility] on 12/14/17.  Once home he devopled SBO.  PMH includes prostate CA.    PT Comments    Pt just got back to bed from walking in hallway with spouse.  Pt stated he has been walking 3 - 4 times a day to help resolve his GI problem but now c/o increased L hip that was radiating down to knee.  Educated and instructed on THR TE'S following HEP handout.  Instructed on L LE positioning and use of ICE.  Spouse present during session esp interested in TE's.  Noted pt only has tylenol listed for pain meds as Narcotics were D/C due to medical.  Pt progressing with his mobility. Consulted RN possible order for oral muscle relaxer vs IV pain meds for pain management post op THR.  Spouse stated Ortho f/u appt is this coming Monday.   Follow Up Recommendations  Home health PT     Equipment Recommendations  None recommended by PT    Recommendations for Other Services       Precautions / Restrictions Precautions Precaution Comments: THR 12/14/17 [facility] Restrictions Weight Bearing Restrictions: No RLE Weight Bearing: Weight bearing as tolerated       Balance                                            Cognition Arousal/Alertness: Awake/alert Behavior During Therapy: WFL for tasks assessed/performed Overall Cognitive Status: Within Functional Limits for tasks assessed                                        Exercises   Total Hip Replacement TE's 10 reps ankle pumps 10 reps knee presses 10 reps heel slides 10 reps SAQ's 10 reps ABD Followed by ICE     General Comments        Pertinent Vitals/Pain Pain Assessment: 0-10 Pain Score: 8  Pain Location: R upper thigh down to R madial knee  Pain Descriptors /  Indicators: Spasm;Discomfort;Operative site guarding Pain Intervention(s): Monitored during session;Repositioned;Ice applied    Home Living                      Prior Function            PT Goals (current goals can now be found in the care plan section)      Frequency    Min 3X/week      PT Plan Current plan remains appropriate    Co-evaluation              AM-PAC PT "6 Clicks" Mobility   Outcome Measure  Help needed turning from your back to your side while in a flat bed without using bedrails?: A Little Help needed moving from lying on your back to sitting on the side of a flat bed without using bedrails?: A Little Help needed moving to and from a bed to a chair (including a wheelchair)?: A Little Help needed standing up from a chair using your arms (e.g., wheelchair or bedside chair)?: A Little Help needed  to walk in hospital room?: A Little Help needed climbing 3-5 steps with a railing? : A Little 6 Click Score: 18    End of Session   Activity Tolerance: Patient tolerated treatment well Patient left: in bed;with call bell/phone within reach;with family/visitor present   PT Visit Diagnosis: Difficulty in walking, not elsewhere classified (R26.2);Muscle weakness (generalized) (M62.81)     Time: 1005-1030 PT Time Calculation (min) (ACUTE ONLY): 25 min  Charges:  $Therapeutic Exercise: 8-22 mins $Self Care/Home Management: 8-22                     Rica Koyanagi  PTA Acute  Rehabilitation Services Pager      [phone] Office      [phone]

## 2017-12-25 NOTE — Care Management Important Message (Signed)
Important Message  Patient Details  Name: Richard Whitaker MRN: [id number] Date of Birth: [birth year]   Medicare Important Message Given:  Yes    Kerin Salen 12/25/2017, 9:50 AMImportant Message  Patient Details  Name: Richard Whitaker MRN: [id number] Date of Birth: July 29, 1945   Medicare Important Message Given:  Yes    Kerin Salen 12/25/2017, 9:49 AM

## 2017-12-25 NOTE — Progress Notes (Signed)
PHARMACIST - PHYSICIAN COMMUNICATION  DR:   Marcello Moores  CONCERNING: IV to Oral Route Change Policy  RECOMMENDATION: This patient is receiving famotidine by the intravenous route.  Based on criteria approved by the Pharmacy and Therapeutics Committee, the intravenous medication(s) is/are being converted to the equivalent oral dose form(s).   DESCRIPTION: These criteria include:  The patient is eating (either orally or via tube) and/or has been taking other orally administered medications for a least 24 hours  The patient has no evidence of active gastrointestinal bleeding or impaired GI absorption (gastrectomy, short bowel, patient on TNA or NPO).  If you have questions about this conversion, please contact the Pharmacy Department  []   [phone] )  [facility] []   [phone] )  [facility] []   [phone] )  [facility] []   [phone] )  [facility] [x]   [phone] )  [place], [place] Day 12/25/2017 7:49 AM

## 2017-12-25 NOTE — Progress Notes (Signed)
  [facility] Progress Note     Subjective: CC-  passing flatus and having BM.  Tolerating fulls Objective: Vital signs in last 24 hours: Temp:  [97.7 F (36.5 C)-98.5 F (36.9 C)] 98.5 F (36.9 C) (11/29 0417) Pulse Rate:  [79-88] 79 (11/29 0417) Resp:  [14-15] 14 (11/29 0417) BP: (129-172)/(80-91) 129/81 (11/29 0417) SpO2:  [97 %-100 %] 97 % (11/29 0417) Last BM Date: 12/24/17  Intake/Output from previous day: 11/28 0701 - 11/29 0700 In: -  Out: 503 [Urine:500; Stool:3] Intake/Output this shift: No intake/output data recorded.  PE: Gen: Alert, NAD, pleasant HEENT: EOM's intact, pupils equal and round Card:2+ DP pulses Pulm: effort normal Abd: Soft,mild distension,nontender Ext:No edema BLE Psych: A&Ox3  Skin: no rashes noted, warm and dry  Lab Results:  Recent Labs    12/23/17 0338 12/24/17 0359  WBC 7.2 6.6  HGB 10.7* 10.1*  HCT 33.8* 32.6*  PLT 316 323   BMET Recent Labs    12/23/17 0338 12/24/17 0359 12/25/17 0410  NA 140 140  --   K 3.4* 3.8  --   CL 107 109  --   CO2 27 25  --   GLUCOSE 126* 125*  --   BUN 9 10  --   CREATININE 0.74 0.79 0.72  CALCIUM 8.4* 8.3*  --    PT/INR No results for input(s): LABPROT, INR in the last 72 hours. CMP     Component Value Date/Time   NA 140 12/24/2017 0359   K 3.8 12/24/2017 0359   CL 109 12/24/2017 0359   CO2 25 12/24/2017 0359   GLUCOSE 125 (H) 12/24/2017 0359   BUN 10 12/24/2017 0359   CREATININE 0.72 12/25/2017 0410   CALCIUM 8.3 (L) 12/24/2017 0359   PROT 7.5 12/18/2017 0937   ALBUMIN 3.5 12/18/2017 0937   AST 40 12/18/2017 0937   ALT 30 12/18/2017 0937   ALKPHOS 78 12/18/2017 0937   BILITOT 1.2 12/18/2017 0937   GFRNONAA >60 12/25/2017 0410   GFRAA >60 12/25/2017 0410   Lipase     Component Value Date/Time   LIPASE 23 12/18/2017 0937       Studies/Results: Dg Abd Portable 1v  Result Date: 12/24/2017 CLINICAL DATA:  Follow up small bowel obstruction  EXAM: PORTABLE ABDOMEN - 1 VIEW COMPARISON:  12/23/2017 FINDINGS: Stable small bowel dilatation is noted in the mid abdomen. No free air is seen. No new focal abnormality is noted. IMPRESSION: Stable small bowel dilatation when compare with the previous day. Electronically Signed   By: Inez Catalina M.D.   On: 12/24/2017 09:25    Anti-infectives: Anti-infectives (From admission, onward)   None       Assessment/Plan HTN DM2 - SSI S/p R THA at [facility] 12/14/17  SBO - prior h/o open prostatectomy - CT 11/22 showed numerous dilated loops of small bowel measuring up to 3.7 cm with a relative transition point in the distal ileum in the lower mid abdomen most consistent with a bowel obstruction AXR today pending ID -none FEN -IVF, NPO VTE -SCDs, lovenox Foley -none  Plan- Patient having bowel function and no abdominal pain.  Recheck AXR.  Start soft diet    LOS: 7 days   Rosario Adie, MD  Colorectal and [facility]

## 2017-12-26 LAB — GLUCOSE, CAPILLARY
Glucose-Capillary: 95 mg/dL (ref 70–99)
Glucose-Capillary: 94 mg/dL (ref 70–99)

## 2017-12-26 NOTE — Discharge Instructions (Signed)
Small Bowel Obstruction °A small bowel obstruction means that something is blocking the small bowel. The small bowel is also called the small intestine. It is the long tube that connects the stomach to the colon. An obstruction will stop food and fluids from passing through the small bowel. Treatment depends on what is causing the problem and how bad the problem is. °Follow these instructions at home: °· Get a lot of rest. °· Follow your diet as told by your doctor. You may need to: °? Only drink clear liquids until you start to get better. °? Avoid solid foods as told by your doctor. °· Take over-the-counter and prescription medicines only as told by your doctor. °· Keep all follow-up visits as told by your doctor. This is important. °Contact a doctor if: °· You have a fever. °· You have chills. °Get help right away if: °· You have pain or cramps that get worse. °· You throw up (vomit) blood. °· You have a feeling of being sick to your stomach (nausea) that does not go away. °· You cannot stop throwing up. °· You cannot drink fluids. °· You feel confused. °· You feel dry or thirsty (dehydrated). °· Your belly gets more bloated. °· You feel weak or you pass out (faint). °This information is not intended to replace advice given to you by your health care provider. Make sure you discuss any questions you have with your health care provider. °Document Released: 02/21/2004 Document Revised: 09/10/2015 Document Reviewed: 03/09/2014 °Elsevier Interactive Patient Education © 2018 Elsevier Inc. ° °

## 2017-12-26 NOTE — Discharge Summary (Signed)
Physician Discharge Summary  Patient ID: Richard Whitaker MRN: [id number] DOB/AGE: [birth year] 70 y.o.  Admit date: 12/18/2017 Discharge date: 12/26/2017  Admission Diagnoses: SBO  Discharge Diagnoses:  Active Problems:   SBO (small bowel obstruction) ([place])   Discharged Condition: good  Hospital Course: Pt was admitted for SBO vs ileus after recent hip surgery.  NG was inserted.  He began to have bowel function and NG was removed and his diet was advanced slowly.  He was discharged tolerating a regular diet.    Consults: None  Significant Diagnostic Studies: labs: cbc, bmet  Treatments: IV hydration  Discharge Exam: Blood pressure (!) 151/91, pulse 91, temperature 98.4 F (36.9 C), temperature source Oral, resp. rate 18, SpO2 99 %. General appearance: alert and cooperative GI: normal findings: soft, non-tender  Disposition: home   Allergies as of 12/26/2017   No Known Allergies     Medication List    TAKE these medications   aspirin 325 MG tablet Take 325 mg by mouth 2 (two) times daily.   atorvastatin 40 MG tablet Commonly known as:  LIPITOR Take 40 mg by mouth daily.   cyclobenzaprine 10 MG tablet Commonly known as:  FLEXERIL Take 5 mg by mouth every 6 (six) hours as needed for muscle spasms.   docusate sodium 100 MG capsule Commonly known as:  COLACE Take 100 mg by mouth daily.   HYDROcodone-acetaminophen 5-325 MG tablet Commonly known as:  NORCO/VICODIN Take 1 tablet by mouth every 4 (four) hours as needed for moderate pain.   leuprolide (6 Month) 45 MG injection Commonly known as:  ELIGARD Inject 45 mg into the skin every 6 (six) months.   meloxicam 15 MG tablet Commonly known as:  MOBIC Take 15 mg by mouth daily.   metFORMIN 1000 MG tablet Commonly known as:  GLUCOPHAGE Take 1,000 mg by mouth 2 (two) times daily with a meal.   metFORMIN 500 MG tablet Commonly known as:  GLUCOPHAGE Take 1 tablet (500 mg total) by mouth 2 (two) times daily  with a meal.   multivitamin with minerals Tabs tablet Take 1 tablet by mouth daily.   omega-3 acid ethyl esters 1 g capsule Commonly known as:  LOVAZA Take 1 g by mouth daily.   omeprazole 40 MG capsule Commonly known as:  PRILOSEC Take 40 mg by mouth 2 (two) times daily.   oxyCODONE 5 MG immediate release tablet Commonly known as:  Oxy IR/ROXICODONE Take 5 mg by mouth every 4 (four) hours as needed for severe pain.   potassium chloride SA 20 MEQ tablet Commonly known as:  K-DUR,KLOR-CON Take 40 mEq by mouth 2 (two) times daily.   pregabalin 75 MG capsule Commonly known as:  LYRICA Take 75 mg by mouth 2 (two) times daily.   triamterene-hydrochlorothiazide 75-50 MG tablet Commonly known as:  MAXZIDE Take 0.5 tablets by mouth daily.      Follow-up Information    [facility] Follow up.   Specialty:  [place] Why:  Home Health Physical Therapy, Occupational Therapy, aide -agency will contact you to arrange initial visit Contact information: [address] [place] [address] [phone]           Signed: Rosario Adie 28/31/5176, 7:21 AM

## 2019-06-12 IMAGING — DX DG ABD PORTABLE 1V
2 series · 2 of 2 positions shown · non-contrast
Comparison: 12/23/2017

CLINICAL DATA: Follow up small bowel obstruction

EXAM:
PORTABLE ABDOMEN - 1 VIEW

[abdomen kub (1 of 2)]
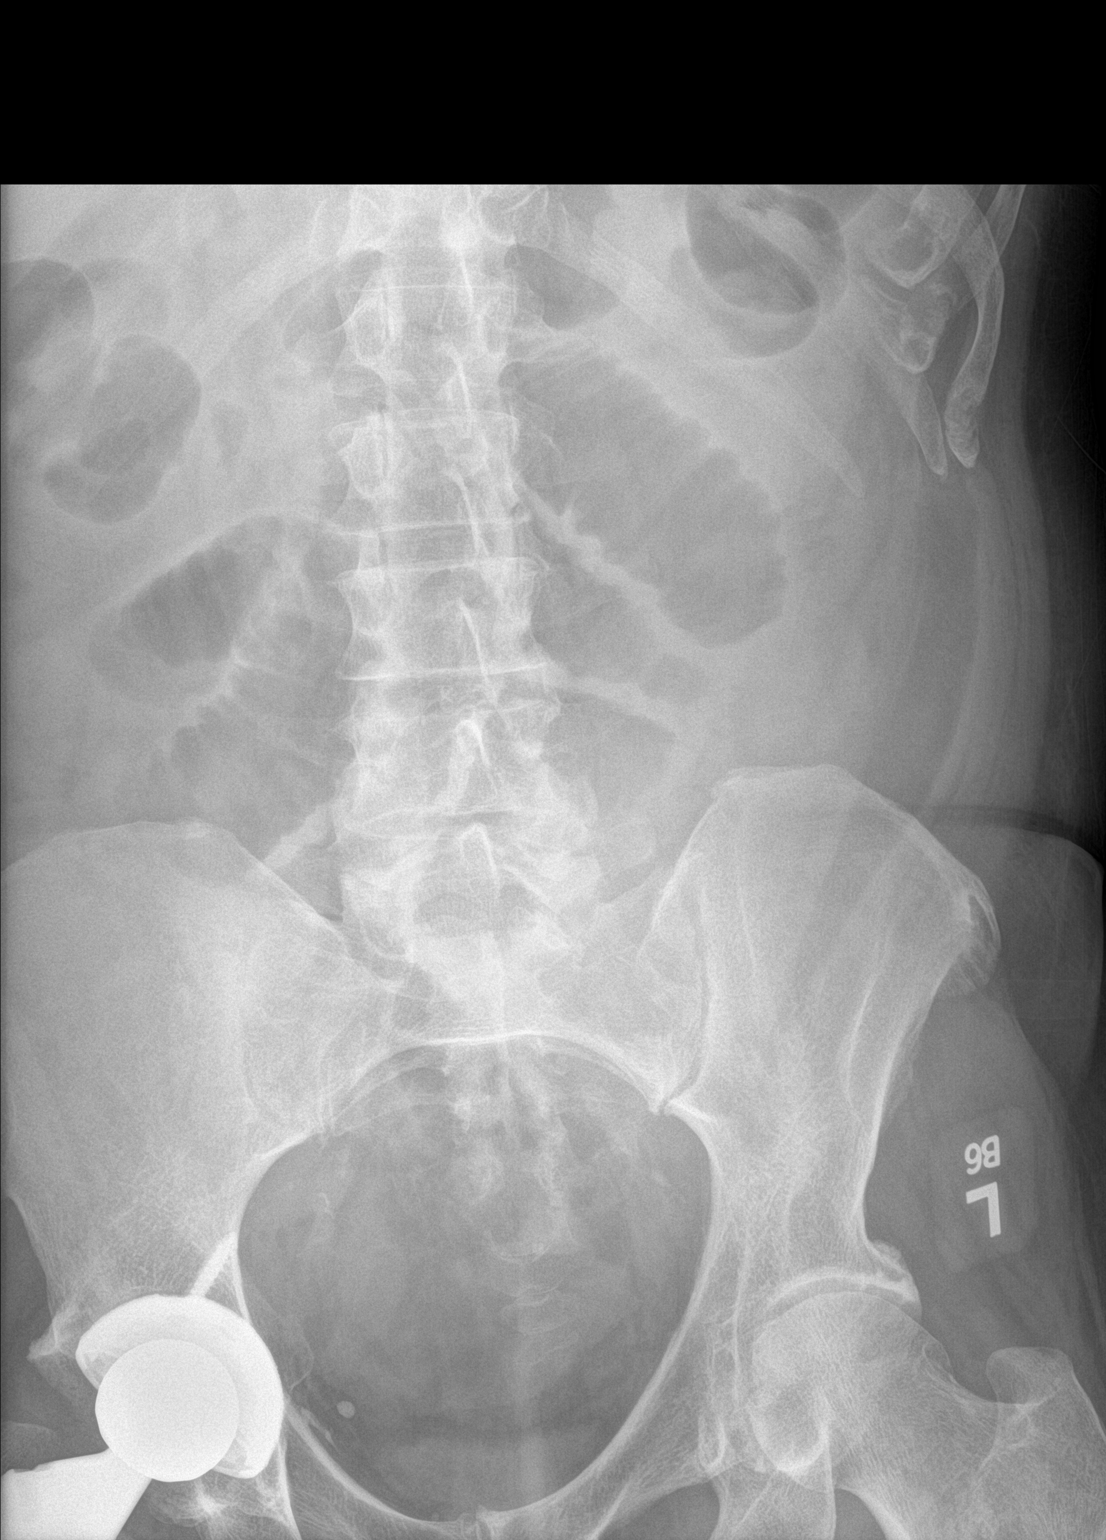

[abdomen kub (2 of 2)]
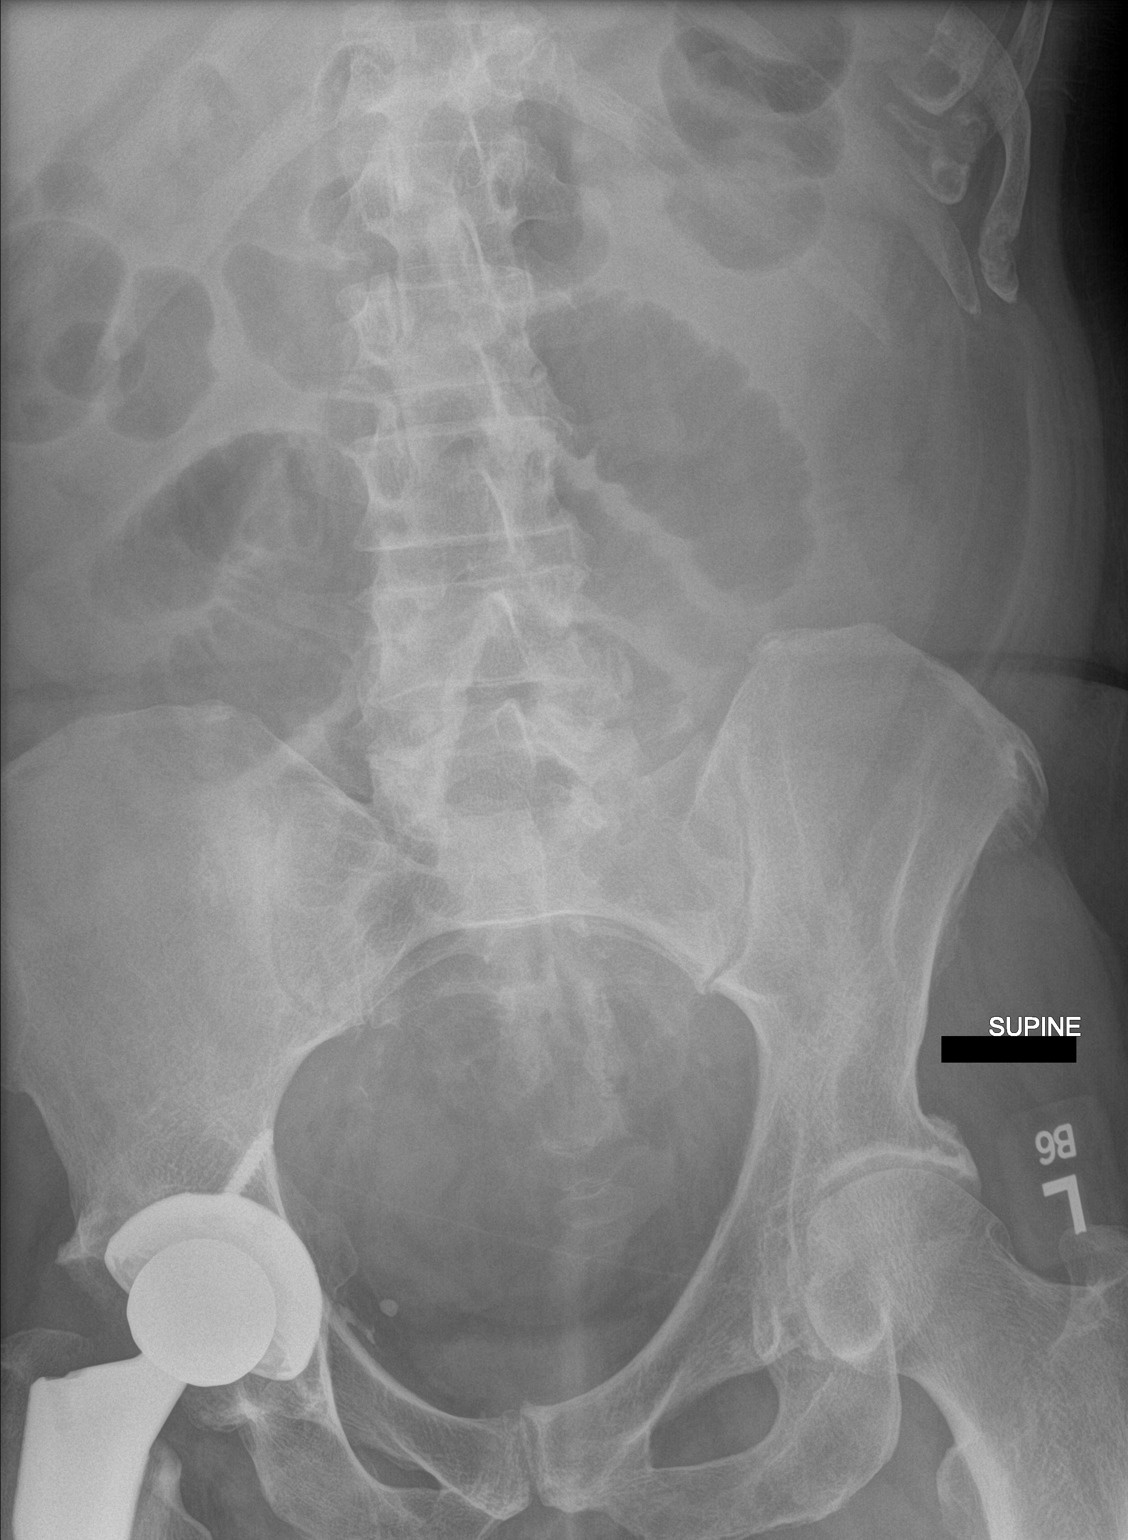

[2 of 2 positions shown; findings below may reference images not displayed]

FINDINGS: Stable small bowel dilatation is noted in the mid abdomen. No free
air is seen. No new focal abnormality is noted.
IMPRESSION: Stable small bowel dilatation when compare with the previous day.

## 2019-12-29 DIAGNOSIS — Z03818 Encounter for observation for suspected exposure to other biological agents ruled out: Secondary | ICD-10-CM | POA: Diagnosis not present

## 2019-12-29 DIAGNOSIS — Z20822 Contact with and (suspected) exposure to covid-19: Secondary | ICD-10-CM | POA: Diagnosis not present

## 2020-05-15 ENCOUNTER — Other Ambulatory Visit: Payer: Self-pay | Admitting: Infectious Diseases

## 2020-05-15 ENCOUNTER — Other Ambulatory Visit: Payer: Self-pay | Admitting: Hematology and Oncology

## 2020-05-16 ENCOUNTER — Other Ambulatory Visit (HOSPITAL_COMMUNITY): Payer: Self-pay | Admitting: Hematology and Oncology

## 2020-05-23 ENCOUNTER — Other Ambulatory Visit (HOSPITAL_COMMUNITY): Payer: Self-pay | Admitting: Hematology and Oncology

## 2020-05-23 DIAGNOSIS — R2231 Localized swelling, mass and lump, right upper limb: Secondary | ICD-10-CM

## 2020-05-23 DIAGNOSIS — R229 Localized swelling, mass and lump, unspecified: Secondary | ICD-10-CM

## 2020-06-05 ENCOUNTER — Ambulatory Visit
Admission: RE | Admit: 2020-06-05 | Discharge: 2020-06-05 | Disposition: A | Discharge status: Home / Self Care | Payer: Self-pay | Source: Ambulatory Visit | Attending: Hematology and Oncology | Admitting: Hematology and Oncology

## 2020-06-05 ENCOUNTER — Other Ambulatory Visit (HOSPITAL_COMMUNITY): Payer: Self-pay | Admitting: Hematology and Oncology

## 2020-06-05 ENCOUNTER — Encounter (HOSPITAL_COMMUNITY): Payer: Self-pay | Admitting: Radiology

## 2020-06-05 DIAGNOSIS — R52 Pain, unspecified: Secondary | ICD-10-CM

## 2020-06-05 NOTE — Progress Notes (Signed)
Richard Whitaker Richard Whitaker Male, 75 y.o., 22-Jun-1945  MRN:  [id number] Phone:  [phone] (H) ...       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   Further evaluation and confirmation could be performed with MRI as indicated.   (tried to call the number to discuss with referring MD but the number is just to the [place]).   Cathren Harsh        Previous Messages   ----- Message -----  From: Garth Bigness D  Sent: 06/05/2020 11:23 AM EDT  To: Ir Procedure Requests  Subject: [place] CORE BIOPSY (SOFT TISSUE)           Procedure:  [place] CORE BIOPSY (SOFT TISSUE)   Reason: mass under right arm , Axillary mass, right, Localized swelling, mass and lump, unspecified   History: Outside imaging uploaded in [place]   Provider: Kizzie Ide I   Provider Contact: [phone]

## 2022-07-16 ENCOUNTER — Inpatient Hospital Stay (HOSPITAL_COMMUNITY)
Admission: EM | Admit: 2022-07-16 | Discharge: 2022-07-28 | DRG: 393 | Disposition: E | Payer: No Typology Code available for payment source | Attending: Pulmonary Disease | Admitting: Pulmonary Disease

## 2022-07-16 ENCOUNTER — Emergency Department (HOSPITAL_COMMUNITY): Payer: No Typology Code available for payment source

## 2022-07-16 ENCOUNTER — Encounter (HOSPITAL_COMMUNITY): Payer: Self-pay | Admitting: Emergency Medicine

## 2022-07-16 ENCOUNTER — Other Ambulatory Visit: Payer: Self-pay

## 2022-07-16 DIAGNOSIS — G9341 Metabolic encephalopathy: Secondary | ICD-10-CM | POA: Diagnosis present

## 2022-07-16 DIAGNOSIS — I4891 Unspecified atrial fibrillation: Secondary | ICD-10-CM | POA: Diagnosis not present

## 2022-07-16 DIAGNOSIS — E861 Hypovolemia: Secondary | ICD-10-CM | POA: Diagnosis present

## 2022-07-16 DIAGNOSIS — K219 Gastro-esophageal reflux disease without esophagitis: Secondary | ICD-10-CM | POA: Diagnosis present

## 2022-07-16 DIAGNOSIS — E1165 Type 2 diabetes mellitus with hyperglycemia: Secondary | ICD-10-CM | POA: Diagnosis present

## 2022-07-16 DIAGNOSIS — K317 Polyp of stomach and duodenum: Secondary | ICD-10-CM | POA: Diagnosis present

## 2022-07-16 DIAGNOSIS — E7220 Disorder of urea cycle metabolism, unspecified: Secondary | ICD-10-CM | POA: Diagnosis present

## 2022-07-16 DIAGNOSIS — N170 Acute kidney failure with tubular necrosis: Secondary | ICD-10-CM | POA: Diagnosis present

## 2022-07-16 DIAGNOSIS — I10 Essential (primary) hypertension: Secondary | ICD-10-CM | POA: Diagnosis present

## 2022-07-16 DIAGNOSIS — K9189 Other postprocedural complications and disorders of digestive system: Principal | ICD-10-CM | POA: Diagnosis present

## 2022-07-16 DIAGNOSIS — E860 Dehydration: Secondary | ICD-10-CM | POA: Diagnosis present

## 2022-07-16 DIAGNOSIS — N179 Acute kidney failure, unspecified: Secondary | ICD-10-CM | POA: Diagnosis not present

## 2022-07-16 DIAGNOSIS — Z96641 Presence of right artificial hip joint: Secondary | ICD-10-CM | POA: Diagnosis present

## 2022-07-16 DIAGNOSIS — R001 Bradycardia, unspecified: Secondary | ICD-10-CM | POA: Diagnosis not present

## 2022-07-16 DIAGNOSIS — K859 Acute pancreatitis without necrosis or infection, unspecified: Secondary | ICD-10-CM | POA: Diagnosis present

## 2022-07-16 DIAGNOSIS — Z8719 Personal history of other diseases of the digestive system: Secondary | ICD-10-CM

## 2022-07-16 DIAGNOSIS — Z781 Physical restraint status: Secondary | ICD-10-CM | POA: Diagnosis not present

## 2022-07-16 DIAGNOSIS — E872 Acidosis, unspecified: Secondary | ICD-10-CM | POA: Diagnosis present

## 2022-07-16 DIAGNOSIS — Z791 Long term (current) use of non-steroidal anti-inflammatories (NSAID): Secondary | ICD-10-CM

## 2022-07-16 DIAGNOSIS — R578 Other shock: Secondary | ICD-10-CM | POA: Diagnosis present

## 2022-07-16 DIAGNOSIS — Z9079 Acquired absence of other genital organ(s): Secondary | ICD-10-CM

## 2022-07-16 DIAGNOSIS — I2489 Other forms of acute ischemic heart disease: Secondary | ICD-10-CM | POA: Diagnosis present

## 2022-07-16 DIAGNOSIS — I472 Ventricular tachycardia, unspecified: Secondary | ICD-10-CM | POA: Diagnosis not present

## 2022-07-16 DIAGNOSIS — I468 Cardiac arrest due to other underlying condition: Secondary | ICD-10-CM | POA: Diagnosis not present

## 2022-07-16 DIAGNOSIS — M1990 Unspecified osteoarthritis, unspecified site: Secondary | ICD-10-CM | POA: Diagnosis present

## 2022-07-16 DIAGNOSIS — Y848 Other medical procedures as the cause of abnormal reaction of the patient, or of later complication, without mention of misadventure at the time of the procedure: Secondary | ICD-10-CM | POA: Diagnosis present

## 2022-07-16 DIAGNOSIS — E875 Hyperkalemia: Secondary | ICD-10-CM | POA: Diagnosis not present

## 2022-07-16 DIAGNOSIS — R7401 Elevation of levels of liver transaminase levels: Secondary | ICD-10-CM | POA: Diagnosis present

## 2022-07-16 DIAGNOSIS — Z7984 Long term (current) use of oral hypoglycemic drugs: Secondary | ICD-10-CM

## 2022-07-16 DIAGNOSIS — Z992 Dependence on renal dialysis: Secondary | ICD-10-CM

## 2022-07-16 DIAGNOSIS — Z87891 Personal history of nicotine dependence: Secondary | ICD-10-CM

## 2022-07-16 DIAGNOSIS — I4892 Unspecified atrial flutter: Secondary | ICD-10-CM | POA: Diagnosis not present

## 2022-07-16 DIAGNOSIS — R34 Anuria and oliguria: Secondary | ICD-10-CM | POA: Diagnosis not present

## 2022-07-16 DIAGNOSIS — R109 Unspecified abdominal pain: Secondary | ICD-10-CM | POA: Diagnosis present

## 2022-07-16 DIAGNOSIS — R7989 Other specified abnormal findings of blood chemistry: Secondary | ICD-10-CM

## 2022-07-16 DIAGNOSIS — R748 Abnormal levels of other serum enzymes: Secondary | ICD-10-CM | POA: Diagnosis not present

## 2022-07-16 DIAGNOSIS — D649 Anemia, unspecified: Secondary | ICD-10-CM | POA: Diagnosis present

## 2022-07-16 DIAGNOSIS — K851 Biliary acute pancreatitis without necrosis or infection: Secondary | ICD-10-CM

## 2022-07-16 DIAGNOSIS — Z7982 Long term (current) use of aspirin: Secondary | ICD-10-CM

## 2022-07-16 DIAGNOSIS — R451 Restlessness and agitation: Secondary | ICD-10-CM | POA: Diagnosis not present

## 2022-07-16 DIAGNOSIS — E785 Hyperlipidemia, unspecified: Secondary | ICD-10-CM | POA: Diagnosis present

## 2022-07-16 DIAGNOSIS — Z8546 Personal history of malignant neoplasm of prostate: Secondary | ICD-10-CM

## 2022-07-16 DIAGNOSIS — Z79899 Other long term (current) drug therapy: Secondary | ICD-10-CM

## 2022-07-16 DIAGNOSIS — R509 Fever, unspecified: Secondary | ICD-10-CM | POA: Diagnosis not present

## 2022-07-16 DIAGNOSIS — R55 Syncope and collapse: Secondary | ICD-10-CM | POA: Diagnosis present

## 2022-07-16 DIAGNOSIS — Z8571 Personal history of Hodgkin lymphoma: Secondary | ICD-10-CM

## 2022-07-16 DIAGNOSIS — R0902 Hypoxemia: Secondary | ICD-10-CM | POA: Diagnosis present

## 2022-07-16 HISTORY — DX: Type 2 diabetes mellitus without complications: E11.9

## 2022-07-16 LAB — CBC WITH DIFFERENTIAL/PLATELET
Abs Immature Granulocytes: 0.04 10*3/uL (ref 0.00–0.07)
Basophils Absolute: 0 10*3/uL (ref 0.0–0.1)
Basophils Relative: 0 %
Eosinophils Absolute: 0 10*3/uL (ref 0.0–0.5)
Eosinophils Relative: 0 %
HCT: 52.8 % — ABNORMAL HIGH (ref 39.0–52.0)
Hemoglobin: 16.6 g/dL (ref 13.0–17.0)
Immature Granulocytes: 0 %
Lymphocytes Relative: 9 %
Lymphs Abs: 1.1 10*3/uL (ref 0.7–4.0)
MCH: 25.6 pg — ABNORMAL LOW (ref 26.0–34.0)
MCHC: 31.4 g/dL (ref 30.0–36.0)
MCV: 81.5 fL (ref 80.0–100.0)
Monocytes Absolute: 0.8 10*3/uL (ref 0.1–1.0)
Monocytes Relative: 6 %
Neutro Abs: 11 10*3/uL — ABNORMAL HIGH (ref 1.7–7.7)
Neutrophils Relative %: 85 %
Platelets: 273 10*3/uL (ref 150–400)
RBC: 6.48 MIL/uL — ABNORMAL HIGH (ref 4.22–5.81)
RDW: 15.5 % (ref 11.5–15.5)
WBC: 13 10*3/uL — ABNORMAL HIGH (ref 4.0–10.5)
nRBC: 0 % (ref 0.0–0.2)

## 2022-07-16 LAB — URINALYSIS, ROUTINE W REFLEX MICROSCOPIC
Bilirubin Urine: NEGATIVE
Glucose, UA: 50 mg/dL — AB
Ketones, ur: NEGATIVE mg/dL
Leukocytes,Ua: NEGATIVE
Nitrite: NEGATIVE
Protein, ur: 100 mg/dL — AB
Specific Gravity, Urine: 1.019 (ref 1.005–1.030)
pH: 5 (ref 5.0–8.0)

## 2022-07-16 LAB — LACTIC ACID, PLASMA
Lactic Acid, Venous: 4.5 mmol/L (ref 0.5–1.9)
Lactic Acid, Venous: 4.5 mmol/L (ref 0.5–1.9)
Lactic Acid, Venous: 7.5 mmol/L (ref 0.5–1.9)
Lactic Acid, Venous: 3.8 mmol/L (ref 0.5–1.9)

## 2022-07-16 LAB — CBG MONITORING, ED: Glucose-Capillary: 271 mg/dL — ABNORMAL HIGH (ref 70–99)

## 2022-07-16 LAB — LIPASE, BLOOD: Lipase: 1765 U/L — ABNORMAL HIGH (ref 11–51)

## 2022-07-16 LAB — TROPONIN I (HIGH SENSITIVITY)
Troponin I (High Sensitivity): 54 ng/L — ABNORMAL HIGH (ref <18)
Troponin I (High Sensitivity): 56 ng/L — ABNORMAL HIGH (ref <18)

## 2022-07-16 LAB — GLUCOSE, CAPILLARY
Glucose-Capillary: 182 mg/dL — ABNORMAL HIGH (ref 70–99)
Glucose-Capillary: 198 mg/dL — ABNORMAL HIGH (ref 70–99)
Glucose-Capillary: 197 mg/dL — ABNORMAL HIGH (ref 70–99)
Glucose-Capillary: 183 mg/dL — ABNORMAL HIGH (ref 70–99)

## 2022-07-16 LAB — COMPREHENSIVE METABOLIC PANEL
ALT: 74 U/L — ABNORMAL HIGH (ref 0–44)
AST: 93 U/L — ABNORMAL HIGH (ref 15–41)
Albumin: 4 g/dL (ref 3.5–5.0)
Alkaline Phosphatase: 71 U/L (ref 38–126)
Anion gap: 16 — ABNORMAL HIGH (ref 5–15)
BUN: 23 mg/dL (ref 8–23)
CO2: 22 mmol/L (ref 22–32)
Calcium: 7.2 mg/dL — ABNORMAL LOW (ref 8.9–10.3)
Chloride: 100 mmol/L (ref 98–111)
Creatinine, Ser: 1.7 mg/dL — ABNORMAL HIGH (ref 0.61–1.24)
GFR, Estimated: 42 mL/min — ABNORMAL LOW (ref >60)
Glucose, Bld: 252 mg/dL — ABNORMAL HIGH (ref 70–99)
Potassium: 3.4 mmol/L — ABNORMAL LOW (ref 3.5–5.1)
Sodium: 138 mmol/L (ref 135–145)
Total Bilirubin: 0.8 mg/dL (ref 0.3–1.2)
Total Protein: 7.7 g/dL (ref 6.5–8.1)

## 2022-07-16 LAB — HEMOGLOBIN A1C
Hgb A1c MFr Bld: 5.9 % — ABNORMAL HIGH (ref 4.8–5.6)
Mean Plasma Glucose: 122.63 mg/dL

## 2022-07-16 MED ORDER — SODIUM CHLORIDE 0.9 % IV BOLUS
1000.0000 mL | Freq: Once | INTRAVENOUS | Status: AC
  Administered 2022-07-16: 1000 mL via INTRAVENOUS

## 2022-07-16 MED ORDER — MORPHINE SULFATE (PF) 4 MG/ML IV SOLN
4.0000 mg | Freq: Once | INTRAVENOUS | Status: AC
  Administered 2022-07-16: 4 mg via INTRAVENOUS
  Filled 2022-07-16: qty 1

## 2022-07-16 MED ORDER — POTASSIUM CHLORIDE IN NACL 20-0.9 MEQ/L-% IV SOLN
INTRAVENOUS | Status: DC
  Administered 2022-07-16: 1000 mL via INTRAVENOUS
  Filled 2022-07-16 (×4): qty 1000

## 2022-07-16 MED ORDER — INSULIN ASPART 100 UNIT/ML IJ SOLN
0.0000 [IU] | Freq: Every day | INTRAMUSCULAR | Status: DC
  Filled 2022-07-16: qty 0.05

## 2022-07-16 MED ORDER — ACETAMINOPHEN 325 MG PO TABS
650.0000 mg | ORAL_TABLET | Freq: Four times a day (QID) | ORAL | Status: DC | PRN
  Administered 2022-07-18 – 2022-07-20 (×6): 650 mg via ORAL
  Filled 2022-07-16 (×7): qty 2

## 2022-07-16 MED ORDER — ONDANSETRON HCL 4 MG/2ML IJ SOLN
4.0000 mg | Freq: Four times a day (QID) | INTRAMUSCULAR | Status: DC | PRN
  Filled 2022-07-16: qty 2

## 2022-07-16 MED ORDER — ALBUTEROL SULFATE (2.5 MG/3ML) 0.083% IN NEBU: 2.5000 mg | INHALATION_SOLUTION | RESPIRATORY_TRACT | Status: DC | PRN

## 2022-07-16 MED ORDER — INSULIN ASPART 100 UNIT/ML IJ SOLN
0.0000 [IU] | Freq: Three times a day (TID) | INTRAMUSCULAR | Status: DC
  Administered 2022-07-16 – 2022-07-17 (×3): 3 [IU] via SUBCUTANEOUS
  Filled 2022-07-16: qty 0.15

## 2022-07-16 MED ORDER — CHLORHEXIDINE GLUCONATE CLOTH 2 % EX PADS
6.0000 | MEDICATED_PAD | Freq: Every day | CUTANEOUS | Status: DC
  Administered 2022-07-16 – 2022-07-20 (×5): 6 via TOPICAL

## 2022-07-16 MED ORDER — ACETAMINOPHEN 650 MG RE SUPP: 650.0000 mg | Freq: Four times a day (QID) | RECTAL | Status: DC | PRN

## 2022-07-16 MED ORDER — SODIUM CHLORIDE 0.9 % IV BOLUS
500.0000 mL | Freq: Once | INTRAVENOUS | Status: AC
  Administered 2022-07-16: 500 mL via INTRAVENOUS

## 2022-07-16 MED ORDER — PIPERACILLIN-TAZOBACTAM 3.375 G IVPB 30 MIN
3.3750 g | Freq: Once | INTRAVENOUS | Status: AC
  Administered 2022-07-16: 3.375 g via INTRAVENOUS
  Filled 2022-07-16: qty 50

## 2022-07-16 MED ORDER — HYDROMORPHONE HCL 1 MG/ML IJ SOLN
0.5000 mg | INTRAMUSCULAR | Status: DC | PRN
  Administered 2022-07-16 – 2022-07-20 (×16): 1 mg via INTRAVENOUS
  Filled 2022-07-16 (×18): qty 1

## 2022-07-16 MED ORDER — DOCUSATE SODIUM 100 MG PO CAPS
100.0000 mg | ORAL_CAPSULE | Freq: Two times a day (BID) | ORAL | Status: DC
  Administered 2022-07-16 (×2): 100 mg via ORAL
  Filled 2022-07-16 (×2): qty 1

## 2022-07-16 MED ORDER — METOPROLOL TARTRATE 5 MG/5ML IV SOLN
2.5000 mg | Freq: Once | INTRAVENOUS | Status: AC | PRN
  Administered 2022-07-16: 2.5 mg via INTRAVENOUS

## 2022-07-16 MED ORDER — PANTOPRAZOLE SODIUM 40 MG IV SOLR
40.0000 mg | Freq: Once | INTRAVENOUS | Status: AC
  Administered 2022-07-16: 40 mg via INTRAVENOUS
  Filled 2022-07-16: qty 10

## 2022-07-16 MED ORDER — POLYETHYLENE GLYCOL 3350 17 G PO PACK: 17.0000 g | PACK | Freq: Every day | ORAL | Status: DC | PRN

## 2022-07-16 MED ORDER — ONDANSETRON HCL 4 MG PO TABS: 4.0000 mg | ORAL_TABLET | Freq: Four times a day (QID) | ORAL | Status: DC | PRN

## 2022-07-16 MED ORDER — METOPROLOL TARTRATE 5 MG/5ML IV SOLN
5.0000 mg | Freq: Four times a day (QID) | INTRAVENOUS | Status: DC | PRN
  Administered 2022-07-18: 5 mg via INTRAVENOUS
  Filled 2022-07-16 (×2): qty 5

## 2022-07-16 MED ORDER — OXYCODONE HCL 5 MG PO TABS
5.0000 mg | ORAL_TABLET | ORAL | Status: DC | PRN
  Administered 2022-07-16 – 2022-07-17 (×2): 5 mg via ORAL
  Filled 2022-07-16 (×2): qty 1

## 2022-07-16 MED ORDER — SODIUM CHLORIDE 0.9 % IV SOLN: Freq: Once | INTRAVENOUS | Status: AC

## 2022-07-16 MED ORDER — HEPARIN SODIUM (PORCINE) 5000 UNIT/ML IJ SOLN
5000.0000 [IU] | Freq: Three times a day (TID) | INTRAMUSCULAR | Status: DC
  Administered 2022-07-16 – 2022-07-20 (×12): 5000 [IU] via SUBCUTANEOUS
  Filled 2022-07-16 (×13): qty 1

## 2022-07-16 MED ORDER — PANTOPRAZOLE SODIUM 40 MG IV SOLR
40.0000 mg | INTRAVENOUS | Status: DC
  Administered 2022-07-17 – 2022-07-20 (×4): 40 mg via INTRAVENOUS
  Filled 2022-07-16 (×4): qty 10

## 2022-07-16 NOTE — ED Provider Notes (Signed)
I provided a substantive portion of the care of this patient.  I personally made/approved the management plan for this patient and take responsibility for the patient management.  EKG Interpretation  Date/Time:  Wednesday July 16 2022 06:24:14 EDT Ventricular Rate:  115 PR Interval:  184 QRS Duration: 90 QT Interval:  315 QTC Calculation: 436 R Axis:   151 Text Interpretation: Sinus tachycardia Ventricular premature complex Right axis deviation Consider anterior infarct Confirmed by Paula Libra ([id number]) on 07/16/2022 6:41:42 AM Also confirmed by Paula Libra ([id number]), editor Dareen Piano, [place] [phone])  on 07/16/2022 7:45:04 AM   Patient is EKG from interpretation shows sinus tachycardia.  Patient presented with abdominal discomfort.  Concern for possible perforation.  He also has evidence of severe pancreatitis.  Abdominal CT without evidence of perforation.  Does show pancreatitis.  GI consulted and patient be admitted to the medicine service   Lorre Nick, MD 07/16/22 1021

## 2022-07-16 NOTE — ED Provider Notes (Signed)
[facility] AT [facility] Provider Note   CSN: [id number] Arrival date & time: 07/16/22  0545     History  Chief Complaint  Patient presents with   Syncope    Richard Whitaker is a 77 y.o. male.  With past medical history of diabetes, hypertension, arthritis, small bowel obstruction who presents to the emergency department with syncope and abdominal pain.  Wife provides majority of the history.  States that the patient had an upper endoscopy yesterday at the [place].  She states that after they got home he had a peanut butter and jelly sandwich and then maybe an hour afterward had an episode of emesis.  She states that [facility] who told her just to monitor him and they will talk with the physician.  She states that overnight after this he had 2 more episodes of large emesis that was yellow in appearance and since then has just been vomiting phlegm.  He has been complaining of moderate to severe abdominal pain and cramping.  It is steady.  It is primarily in the upper abdomen.  The wife states that earlier this morning he had gotten up to use the restroom and she found him on the toilet saying he was unresponsive so she called EMS.  He states he is still having pain but currently does not feel nauseated.  He denies having any diarrhea.  He denies any urinary symptoms.   HPI     Home Medications Prior to Admission medications   Medication Sig Start Date End Date Taking? Authorizing Provider  aspirin 325 MG tablet Take 325 mg by mouth 2 (two) times daily.    [provider]  atorvastatin (LIPITOR) 40 MG tablet Take 40 mg by mouth daily.    [provider]  cyclobenzaprine (FLEXERIL) 10 MG tablet Take 5 mg by mouth every 6 (six) hours as needed for muscle spasms.    [provider]  docusate sodium (COLACE) 100 MG capsule Take 100 mg by mouth daily.    [provider]  HYDROcodone-acetaminophen (NORCO/VICODIN)  5-325 MG tablet Take 1 tablet by mouth every 4 (four) hours as needed for moderate pain.    [provider]  leuprolide, 6 Month, (ELIGARD) 45 MG injection Inject 45 mg into the skin every 6 (six) months.    [provider]  meloxicam (MOBIC) 15 MG tablet Take 15 mg by mouth daily.    [provider]  metFORMIN (GLUCOPHAGE) 1000 MG tablet Take 1,000 mg by mouth 2 (two) times daily with a meal.    [provider]  metFORMIN (GLUCOPHAGE) 500 MG tablet Take 1 tablet (500 mg total) by mouth 2 (two) times daily with a meal. Patient not taking: Reported on 12/18/2017 08/22/13   Gilda Crease, MD  Multiple Vitamin (MULTIVITAMIN WITH MINERALS) TABS tablet Take 1 tablet by mouth daily.    [provider]  omega-3 acid ethyl esters (LOVAZA) 1 G capsule Take 1 g by mouth daily.    [provider]  omeprazole (PRILOSEC) 40 MG capsule Take 40 mg by mouth 2 (two) times daily.    [provider]  oxyCODONE (OXY IR/ROXICODONE) 5 MG immediate release tablet Take 5 mg by mouth every 4 (four) hours as needed for severe pain.    [provider]  potassium chloride SA (K-DUR,KLOR-CON) 20 MEQ tablet Take 40 mEq by mouth 2 (two) times daily.    [provider]  pregabalin (LYRICA) 75 MG capsule Take 75 mg by mouth 2 (two) times daily.    [provider]  triamterene-hydrochlorothiazide (MAXZIDE) 75-50 MG per tablet Take 0.5 tablets by mouth daily.    [provider]      Allergies    Patient has no known allergies.    Review of Systems   Review of Systems  Gastrointestinal:  Positive for abdominal pain, nausea and vomiting.  All other systems reviewed and are negative.   Physical Exam Updated Vital Signs BP (!) 169/103   Pulse (!) 105   Temp 98.2 F (36.8 C) (Oral)   Resp (!) 29   Ht 6\' 1"  (1.854 m)   Wt 90.7 kg   SpO2 96%   BMI 26.39 kg/m  Physical Exam Vitals and nursing note reviewed.   Constitutional:      General: He is in acute distress.     Appearance: He is ill-appearing.  HENT:     Head: Normocephalic.     Mouth/Throat:     Mouth: Mucous membranes are dry.     Pharynx: Oropharynx is clear.  Eyes:     General: No scleral icterus.    Extraocular Movements: Extraocular movements intact.  Cardiovascular:     Rate and Rhythm: Regular rhythm. Tachycardia present.     Pulses: Normal pulses.     Heart sounds: No murmur heard. Pulmonary:     Effort: Pulmonary effort is normal. No respiratory distress.  Abdominal:     General: Abdomen is protuberant. Bowel sounds are decreased. There is distension.     Tenderness: There is abdominal tenderness in the epigastric area and periumbilical area. There is guarding.  Skin:    General: Skin is warm and dry.     Capillary Refill: Capillary refill takes less than 2 seconds.  Neurological:     General: No focal deficit present.     Mental Status: He is alert and oriented to person, place, and time.  Psychiatric:        Mood and Affect: Mood normal.        Behavior: Behavior normal.     ED Results / Procedures / Treatments   Labs (all labs ordered are listed, but only abnormal results are displayed) Labs Reviewed  CBC WITH DIFFERENTIAL/PLATELET - Abnormal; Notable for the following components:      Result Value   WBC 13.0 (*)    RBC 6.48 (*)    HCT 52.8 (*)    MCH 25.6 (*)    Neutro Abs 11.0 (*)    All other components within normal limits  LIPASE, BLOOD - Abnormal; Notable for the following components:   Lipase 1,765 (*)    All other components within normal limits  COMPREHENSIVE METABOLIC PANEL - Abnormal; Notable for the following components:   Potassium 3.4 (*)    Glucose, Bld 252 (*)    Creatinine, Ser 1.70 (*)    Calcium 7.2 (*)    AST 93 (*)    ALT 74 (*)    GFR, Estimated 42 (*)    Anion gap 16 (*)    All other components within normal limits  URINALYSIS, ROUTINE W REFLEX MICROSCOPIC - Abnormal;  Notable for the following components:   APPearance HAZY (*)    Glucose, UA 50 (*)    Hgb urine dipstick MODERATE (*)    Protein, ur 100 (*)    Bacteria, UA RARE (*)    All other components within normal limits  LACTIC ACID,  PLASMA - Abnormal; Notable for the following components:   Lactic Acid, Venous 4.5 (*)    All other components within normal limits  LACTIC ACID, PLASMA - Abnormal; Notable for the following components:   Lactic Acid, Venous 4.5 (*)    All other components within normal limits  CBG MONITORING, ED - Abnormal; Notable for the following components:   Glucose-Capillary 271 (*)    All other components within normal limits  TROPONIN I (HIGH SENSITIVITY) - Abnormal; Notable for the following components:   Troponin I (High Sensitivity) 54 (*)    All other components within normal limits  TROPONIN I (HIGH SENSITIVITY) - Abnormal; Notable for the following components:   Troponin I (High Sensitivity) 56 (*)    All other components within normal limits    EKG EKG Interpretation  Date/Time:  Wednesday July 16 2022 06:24:14 EDT Ventricular Rate:  115 PR Interval:  184 QRS Duration: 90 QT Interval:  315 QTC Calculation: 436 R Axis:   151 Text Interpretation: Sinus tachycardia Ventricular premature complex Right axis deviation Consider anterior infarct Confirmed by Paula Libra ([id number]) on 07/16/2022 6:41:42 AM Also confirmed by Paula Libra ([id number]), editor Dareen Piano, Miranda [phone])  on 07/16/2022 7:45:04 AM  Radiology CT ABDOMEN PELVIS WO CONTRAST  Result Date: 07/16/2022 CLINICAL DATA:  Peritonitis or perforation suspected. EXAM: CT ABDOMEN AND PELVIS WITHOUT CONTRAST TECHNIQUE: Multidetector CT imaging of the abdomen and pelvis was performed following the standard protocol without IV contrast. RADIATION DOSE REDUCTION: This exam was performed according to the departmental dose-optimization program which includes automated exposure control, adjustment of the mA and/or kV  according to patient size and/or use of iterative reconstruction technique. COMPARISON:  CT abdomen/pelvis 12/18/2017. FINDINGS: Lower chest: Small left pleural effusion with adjacent atelectasis in the left lung base. Coronary artery calcifications. Hepatobiliary: Hepatic steatosis. Unchanged benign cysts in both lobes of the liver. Mild perihepatic ascites. No biliary dilatation. Gallbladder is unremarkable. Pancreas: Extensive edema and peripancreatic fat stranding, consistent with acute pancreatitis. No organized peripancreatic fluid collection. Spleen: Mild perisplenic ascites. Adrenals/Urinary Tract: Adrenal glands are unremarkable. Unchanged bilateral simple renal cysts. No nephrolithiasis or hydronephrosis. Within limits of metal artifact from right total hip arthroplasty, bladder is unremarkable. Stomach/Bowel: Inflammatory fat stranding and reactive wall thickening of the duodenum. No dilated loops of small bowel. Normal appendix is visualized on axial image 61 series 3. Mild wall thickening and surrounding fat stranding along the hepatic and splenic flexures, likely reactive in the setting of acute pancreatitis. Colon is otherwise unremarkable. Vascular/Lymphatic: Aortic atherosclerosis. No enlarged abdominal or pelvic lymph nodes. Reproductive: Prostate is unremarkable. Other: Small volume ascites.  No abdominal wall hernia. Musculoskeletal: Prior right total hip arthroplasty. Moderate degenerative changes of the left hip joint. IMPRESSION: 1. Extensive pancreatic edema and peripancreatic fat stranding, consistent with acute pancreatitis. No organized peripancreatic fluid collection. 2. Mild wall thickening and surrounding fat stranding along the hepatic and splenic flexures, likely reactive in the setting of acute pancreatitis. 3. Small volume ascites. 4. Small left pleural effusion with adjacent atelectasis in the left lung base. Aortic Atherosclerosis (ICD10-I70.0). Electronically Signed   By: Orvan Falconer M.D.   On: 07/16/2022 08:42   DG Abd Acute W/Chest  Result Date: 07/16/2022 CLINICAL DATA:  Abdominal cramping since endoscopy EXAM: DG ABDOMEN ACUTE WITH 1 VIEW CHEST COMPARISON:  07/16/2022 FINDINGS: No focal consolidation. No pleural effusion or pneumothorax. Heart and mediastinal contours are unremarkable. Gaseous distension of the transverse colon. No bowel dilatation to suggest obstruction. No air-fluid  levels. No pneumoperitoneum, portal venous gas, or pneumatosis. No urolithiasis. No acute osseous abnormality. IMPRESSION: Negative abdominal radiographs.  No acute cardiopulmonary disease. Electronically Signed   By: Elige Ko M.D.   On: 07/16/2022 07:59   DG Chest Port 1 View  Result Date: 07/16/2022 CLINICAL DATA:  Syncope and vomiting. EXAM: PORTABLE CHEST 1 VIEW COMPARISON:  12/18/2017 FINDINGS: Low volume film. The cardiopericardial silhouette is within normal limits for size. Vascular crowding at the bases. No focal consolidation or airspace pulmonary edema. No pleural effusion. No acute bony abnormality. IMPRESSION: Low volume film without acute cardiopulmonary findings. Electronically Signed   By: Kennith Center M.D.   On: 07/16/2022 06:34    Procedures .Critical Care  Performed by: Cristopher Peru, PA-C Authorized by: Cristopher Peru, PA-C   Critical care provider statement:    Critical care time (minutes):  45   Critical care time was exclusive of:  Separately billable procedures and treating other patients   Critical care was necessary to treat or prevent imminent or life-threatening deterioration of the following conditions:  Metabolic crisis, sepsis and renal failure   Critical care was time spent personally by me on the following activities:  Development of treatment plan with patient or surrogate, discussions with consultants, discussions with primary provider, evaluation of patient's response to treatment, examination of patient, interpretation of cardiac output  measurements, obtaining history from patient or surrogate, review of old charts, re-evaluation of patient's condition, ordering and review of radiographic studies, ordering and review of laboratory studies, pulse oximetry and ordering and performing treatments and interventions   I assumed direction of critical care for this patient from another provider in my specialty: no     Care discussed with: admitting provider      Medications Ordered in ED Medications  pantoprazole (PROTONIX) injection 40 mg (has no administration in time range)  sodium chloride 0.9 % bolus 1,000 mL (has no administration in time range)  sodium chloride 0.9 % bolus 1,000 mL (0 mLs Intravenous Stopped 07/16/22 0948)  piperacillin-tazobactam (ZOSYN) IVPB 3.375 g (0 g Intravenous Stopped 07/16/22 0822)  morphine (PF) 4 MG/ML injection 4 mg (4 mg Intravenous Given 07/16/22 0820)  0.9 %  sodium chloride infusion ( Intravenous New Bag/Given 07/16/22 0949)    ED Course/ Medical Decision Making/ A&P    Medical Decision Making Amount and/or Complexity of Data Reviewed Labs: ordered. Radiology: ordered.  Risk Prescription drug management. Decision regarding hospitalization.  Initial Impression and Ddx 77 year old male who presents to the emergency department with abdominal pain Patient PMH that increases complexity of ED encounter: Small bowel obstruction, hypertension, diabetes Differential: Acute hepatobiliary disease, pancreatitis, appendicitis, PUD, gastritis, SBO, AAA, diverticulitis, colitis, viral gastroenteritis, Crohn's, UC, vascular catastrophe, UTI, pyelonephritis, renal stone, obstructed stone, infected stone, testicular torsion, epididymitis, incarcerated hernia, STD, etc.    Interpretation of Diagnostics I independent reviewed and interpreted the labs as followed: Lipase 1765, UA with no evidence of UTI, troponin 50s, lactic is 4.5, AKI with creatinine of 1.7, anion gap of 16, likely from lactic acidosis.  He  has mild transaminitis without elevation of his bilirubin.  White count is 13  - I independently visualized the following imaging with scope of interpretation limited to determining acute life threatening conditions related to emergency care: Plain film of the chest and abdomen no acute abdomen.  CT on pelvis shows extensive pancreatitis  Patient Reassessment and Ultimate Disposition/Management 77 year old male who presents to the emergency department with abdominal pain.  He is uncomfortable  appearing on exam.  He is tachycardic and hypertensive in the room.  Feels mildly clammy to touch.  He is holding his abdomen saying that it hurts. On my exam his abdomen does appear to be mildly distended and firm.  He is having pain even to light palpation.  There is decreased bowel sounds.  He is also tachycardic with frequent ectopy. I am concerned initially for a perforated bowel.  Prior to my exam nursing had initiated a chest x-ray, EKG and basic labs.  I am adding on an acute abdomen x-ray, troponin and lactic.  Will start some IV fluids.  Not currently nauseated.  Concern for perforated bowel versus bowel obstruction.  If the x-ray does not show any free air we will proceed with CT exam.  0745: Reevaluated, continues to have pain.  Dosing with morphine at this time.  I have also started Zosyn.  His lactic returned at 4.5 and I have high concern over bowel perforation.  Chest x-ray does not show any free air and I am proceeding with a CT exam.  I have ordered code medical so that his CT will be done now..  1610: Acute abdomen x-ray came back negative.  CT has been completed.  Pending read.  9604: Still pending CT, will lipase is 1700, troponins 54, AKI with a creatinine of 1.7.  Elevated lipase and concern is from bowel perforation with local inflammation.  I have called [facility] radiology for a stat read.  Currently being read by Dr. Ernestina Penna with radiology.  51: Spoke with PA with the [facility].   Recommends IV Protonix for now.  No further intervention other than what we have done so far.  She states that Dr. Dulce Sellar will come see the patient.  40: Spoke with Dr. Erenest Blank, hospitalist who agrees to admit the patient.  Patient will be admitted for acute pancreatitis.  Unclear etiology of his pancreatitis.  He did have an EGD yesterday which was not an ERCP or other intervention.  He had 2 polyps that were biopsied.  It does not appear that he has had a bowel perforation on CT imaging.  He does not have a history of pancreatitis.  No significant alcohol history.  There was no biliary ductal dilation seen on CT imaging or evidence of cholecystitis.  GI will see the patient for ongoing intervention.  Patient agrees to plan of care.  Wife has been updated.  Patient management required discussion with the following services or consulting groups:  Hospitalist Service and Gastroenterology  Complexity of Problems Addressed Acute illness or injury that poses threat of life of bodily function  Additional Data Reviewed and Analyzed Further history obtained from: Further history from spouse/family member, Past medical history and medications listed in the EMR, Recent discharge summary, Care Everywhere, and Prior labs/imaging results  Patient Encounter Risk Assessment Use of parenteral controlled substances and Consideration of hospitalization  Final Clinical Impression(s) / ED Diagnoses Final diagnoses:  Acute pancreatitis, unspecified complication status, unspecified pancreatitis type    Rx / DC Orders ED Discharge Orders     None         Cristopher Peru, PA-C 07/16/22 0959    Lorre Nick, MD 07/16/22 1128

## 2022-07-16 NOTE — H&P (Addendum)
History and Physical  Richard Whitaker ZOX:[id number] DOB: 1946/01/02 DOA: 07/22/2022  PCP: [facility]   Chief Complaint: Abdominal pain, vomiting  HPI: Richard Whitaker is a 77 y.o. male with medical history significant for non-insulin-dependent type 2 diabetes, hypertension presents to the emergency department with complaints of syncope, abdominal pain, and vomiting since yesterday.  He had an upper endoscopy yesterday, with duodenal biopsies and cautery of duodenal polyps.  A few hours after this per the patient, he started having severe diffuse anterior abdominal pain without radiation, and associated nausea with vomiting.  Denies any fevers, chills, chest pain, no prior history of abdominal pain like this.  ED Course: He was brought in by ambulance from home, they called EMS when he had a syncopal episode while on the toilet this morning.  On evaluation in the ER, patient was tachycardic, hypertensive, saturating well on room air.  Lab work showed creatinine 1.70 from normal baseline, lipase 1765, AST 93, ALT 74, troponin flat at 54 and 56, lactic acid 5.4, WBC 13.  She was given analgesics, IV fluids.  CT scan was done, consistent with acute pancreatitis as detailed below.  Patient has been kept n.p.o., was already seen by gastroenterology who recommends supportive care and hospitalist admission.  Review of Systems: Please see HPI for pertinent positives and negatives. A complete 10 system review of systems are otherwise negative.  Past Medical History:  Diagnosis Date   Arthritis    DM (diabetes mellitus) (HCC)    Hypertension    Prostate cancer (HCC)    Reflux    Past Surgical History:  Procedure Laterality Date   hip replacement  Right 12/14/2017   TRANSURETHRAL RESECTION OF PROSTATE      Social History:  reports that he quit smoking about 18 years ago. His smoking use included cigarettes. He has never used smokeless tobacco. He reports current alcohol use. He  reports that he does not use drugs.   No Known Allergies  History reviewed. No pertinent family history.   Prior to Admission medications   Medication Sig Start Date End Date Taking? Authorizing Provider  aspirin 325 MG tablet Take 325 mg by mouth 2 (two) times daily.    [provider]  atorvastatin (LIPITOR) 40 MG tablet Take 40 mg by mouth daily.    [provider]  cyclobenzaprine (FLEXERIL) 10 MG tablet Take 5 mg by mouth every 6 (six) hours as needed for muscle spasms.    [provider]  docusate sodium (COLACE) 100 MG capsule Take 100 mg by mouth daily.    [provider]  HYDROcodone-acetaminophen (NORCO/VICODIN) 5-325 MG tablet Take 1 tablet by mouth every 4 (four) hours as needed for moderate pain.    [provider]  leuprolide, 6 Month, (ELIGARD) 45 MG injection Inject 45 mg into the skin every 6 (six) months.    [provider]  meloxicam (MOBIC) 15 MG tablet Take 15 mg by mouth daily.    [provider]  metFORMIN (GLUCOPHAGE) 1000 MG tablet Take 1,000 mg by mouth 2 (two) times daily with a meal.    [provider]  metFORMIN (GLUCOPHAGE) 500 MG tablet Take 1 tablet (500 mg total) by mouth 2 (two) times daily with a meal. Patient not taking: Reported on 12/18/2017 08/22/13   Gilda Crease, MD  Multiple Vitamin (MULTIVITAMIN WITH MINERALS) TABS tablet Take 1 tablet by mouth daily.    [provider]  omega-3 acid ethyl esters (  LOVAZA) 1 G capsule Take 1 g by mouth daily.    [provider]  omeprazole (PRILOSEC) 40 MG capsule Take 40 mg by mouth 2 (two) times daily.    [provider]  oxyCODONE (OXY IR/ROXICODONE) 5 MG immediate release tablet Take 5 mg by mouth every 4 (four) hours as needed for severe pain.    [provider]  potassium chloride SA (K-DUR,KLOR-CON) 20 MEQ tablet Take 40 mEq by mouth 2 (two) times daily.    [provider]  pregabalin  (LYRICA) 75 MG capsule Take 75 mg by mouth 2 (two) times daily.    [provider]  triamterene-hydrochlorothiazide (MAXZIDE) 75-50 MG per tablet Take 0.5 tablets by mouth daily.    [provider]    Physical Exam: BP (!) 161/113   Pulse 99   Temp 98.2 F (36.8 C) (Oral)   Resp (!) 34   Ht 6\' 1"  (1.854 m)   Wt 90.7 kg   SpO2 97%   BMI 26.39 kg/m   General:  Alert, oriented, calm, in no acute distress, clinically looks tired and a little dehydrated.  Wife at the bedside. Eyes: EOMI, clear conjuctivae, white sclerea Neck: supple, no masses, trachea mildline  Cardiovascular: RRR, no murmurs or rubs, no peripheral edema  Respiratory: clear to auscultation bilaterally, no wheezes, no crackles  Abdomen: soft, lightly distended and diffusely tender, but nonacute, normal bowel tones heard  Skin: dry, no rashes  Musculoskeletal: no joint effusions, normal range of motion  Psychiatric: appropriate affect, normal speech  Neurologic: extraocular muscles intact, clear speech, moving all extremities with intact sensorium          Labs on Admission:  Basic Metabolic Panel: Recent Labs  Lab 07-23-2022 0643  NA 138  K 3.4*  CL 100  CO2 22  GLUCOSE 252*  BUN 23  CREATININE 1.70*  CALCIUM 7.2*   Liver Function Tests: Recent Labs  Lab 23-Jul-2022 0643  AST 93*  ALT 74*  ALKPHOS 71  BILITOT 0.8  PROT 7.7  ALBUMIN 4.0   Recent Labs  Lab 2022/07/23 0643  LIPASE 1,765*   No results for input(s): "AMMONIA" in the last 168 hours. CBC: Recent Labs  Lab 07/23/2022 0624  WBC 13.0*  NEUTROABS 11.0*  HGB 16.6  HCT 52.8*  MCV 81.5  PLT 273   Cardiac Enzymes: No results for input(s): "CKTOTAL", "CKMB", "CKMBINDEX", "TROPONINI" in the last 168 hours.  BNP (last 3 results) No results for input(s): "BNP" in the last 8760 hours.  ProBNP (last 3 results) No results for input(s): "PROBNP" in the last 8760 hours.  CBG: Recent Labs  Lab 2022/07/23 0602  GLUCAP 271*     Radiological Exams on Admission: CT ABDOMEN PELVIS WO CONTRAST  Result Date: July 23, 2022 CLINICAL DATA:  Peritonitis or perforation suspected. EXAM: CT ABDOMEN AND PELVIS WITHOUT CONTRAST TECHNIQUE: Multidetector CT imaging of the abdomen and pelvis was performed following the standard protocol without IV contrast. RADIATION DOSE REDUCTION: This exam was performed according to the departmental dose-optimization program which includes automated exposure control, adjustment of the mA and/or kV according to patient size and/or use of iterative reconstruction technique. COMPARISON:  CT abdomen/pelvis 12/18/2017. FINDINGS: Lower chest: Small left pleural effusion with adjacent atelectasis in the left lung base. Coronary artery calcifications. Hepatobiliary: Hepatic steatosis. Unchanged benign cysts in both lobes of the liver. Mild perihepatic ascites. No biliary dilatation. Gallbladder is unremarkable. Pancreas: Extensive edema and peripancreatic fat stranding, consistent with acute pancreatitis. No organized peripancreatic  fluid collection. Spleen: Mild perisplenic ascites. Adrenals/Urinary Tract: Adrenal glands are unremarkable. Unchanged bilateral simple renal cysts. No nephrolithiasis or hydronephrosis. Within limits of metal artifact from right total hip arthroplasty, bladder is unremarkable. Stomach/Bowel: Inflammatory fat stranding and reactive wall thickening of the duodenum. No dilated loops of small bowel. Normal appendix is visualized on axial image 61 series 3. Mild wall thickening and surrounding fat stranding along the hepatic and splenic flexures, likely reactive in the setting of acute pancreatitis. Colon is otherwise unremarkable. Vascular/Lymphatic: Aortic atherosclerosis. No enlarged abdominal or pelvic lymph nodes. Reproductive: Prostate is unremarkable. Other: Small volume ascites.  No abdominal wall hernia. Musculoskeletal: Prior right total hip arthroplasty. Moderate degenerative changes of  the left hip joint. IMPRESSION: 1. Extensive pancreatic edema and peripancreatic fat stranding, consistent with acute pancreatitis. No organized peripancreatic fluid collection. 2. Mild wall thickening and surrounding fat stranding along the hepatic and splenic flexures, likely reactive in the setting of acute pancreatitis. 3. Small volume ascites. 4. Small left pleural effusion with adjacent atelectasis in the left lung base. Aortic Atherosclerosis (ICD10-I70.0). Electronically Signed   By: Orvan Falconer M.D.   On: 07/22/2022 08:42   DG Abd Acute W/Chest  Result Date: 06/29/2022 CLINICAL DATA:  Abdominal cramping since endoscopy EXAM: DG ABDOMEN ACUTE WITH 1 VIEW CHEST COMPARISON:  07/13/2022 FINDINGS: No focal consolidation. No pleural effusion or pneumothorax. Heart and mediastinal contours are unremarkable. Gaseous distension of the transverse colon. No bowel dilatation to suggest obstruction. No air-fluid levels. No pneumoperitoneum, portal venous gas, or pneumatosis. No urolithiasis. No acute osseous abnormality. IMPRESSION: Negative abdominal radiographs.  No acute cardiopulmonary disease. Electronically Signed   By: Elige Ko M.D.   On: 07/27/2022 07:59   DG Chest Port 1 View  Result Date: 07/23/2022 CLINICAL DATA:  Syncope and vomiting. EXAM: PORTABLE CHEST 1 VIEW COMPARISON:  12/18/2017 FINDINGS: Low volume film. The cardiopericardial silhouette is within normal limits for size. Vascular crowding at the bases. No focal consolidation or airspace pulmonary edema. No pleural effusion. No acute bony abnormality. IMPRESSION: Low volume film without acute cardiopulmonary findings. Electronically Signed   By: Kennith Center M.D.   On: 07/27/2022 06:34    Assessment/Plan This is a pleasant 77 year old gentleman with a history of non-insulin-dependent type 2 diabetes, hypertension being admitted to the hospital with acute pancreatitis after duodenal biopsies and cautery yesterday.  Acute  pancreatitis-possibly due to inadvertent disturbance of the ampulla, no stones or ductal dilatation noted -Inpatient admission -Copious IV fluids -IV PPI daily -Pain and nausea medications as needed -Keep n.p.o. except for sips and ice chips -Appreciate GI recommendations  Lactic acidosis-due to vomiting/dehydration, continue hydration, doubt sepsis  Acute renal failure-patient with baseline normal renal function, current renal insufficiency likely due to relative dehydration from pancreatitis, and vomiting -Avoid nephrotoxins -Aggressive hydration -Recheck renal function in the morning, with plan for renal ultrasound if not improving  Abnormal LFTs-possibly due to recent inadvertent ampullary interruption, and vomiting/pancreatitis. -Trend with daily labs -Acute hepatitis panel and right upper quadrant ultrasound if not improving  Poorly controlled type 2 diabetes-last hemoglobin A1c in our system from 2015 was greater than 11.  Patient is currently on metformin at home. -Hold metformin -Sliding scale insulin -Update hemoglobin A1c  Hypertension-blood pressure currently uncontrolled likely due to abdominal discomfort -Continue home medications once reconciled -IV Lopressor as needed SBP over 160  DVT prophylaxis: Lovenox     Code Status: Full Code  Consults called: None  Admission status: The appropriate patient status for this  patient is INPATIENT. Inpatient status is judged to be reasonable and necessary in order to provide the required intensity of service to ensure the patient's safety. The patient's presenting symptoms, physical exam findings, and initial radiographic and laboratory data in the context of their chronic comorbidities is felt to place them at high risk for further clinical deterioration. Furthermore, it is not anticipated that the patient will be medically stable for discharge from the hospital within 2 midnights of admission.    I certify that at the point of  admission it is my clinical judgment that the patient will require inpatient hospital care spanning beyond 2 midnights from the point of admission due to high intensity of service, high risk for further deterioration and high frequency of surveillance required  Time spent: 56 minutes  ADDENDUM 6:12pm: Rapid response called for pre-syncope when patient got up with RN in room to have BM. I evaluated patient in person. By the time I arrived, his BP was rising (initial cuff read systolic 40) and patient spontaneously woke up when placed in [place]. Remained tachycardic, complaining of abdominal pain. He has had very little UOP and lactic acid elevated. I think he is simply dry. Will bolus another liter of fluid, check lactate. Will transfer to SDU for close monitoring.  Tuyet Bader Sharlette Dense MD [facility] Pager [phone]  If 7PM-7AM, please contact night-coverage [email or web address] Password [facility]  07/12/2022, 10:33 AM

## 2022-07-16 NOTE — Consult Note (Signed)
uncomfortable-appearing and cooperative in NAD Head:  Normocephalic and atraumatic. Eyes:  Sclera clear, no icterus.   Conjunctiva pink. Ears:  Normal auditory acuity. Nose:  No deformity, discharge,  or lesions. Mouth:  No deformity or lesions.  Oropharynx pink & moist. Neck:  Supple; no masses or thyromegaly. Lungs:  No respiratory distress Abdomen:  Moderate tenderness epigastric with voluntary guarding. No masses, hepatosplenomegaly or hernias noted. without guarding, and without rebound.     Msk:  Symmetrical without gross deformities. Normal posture. Pulses:  Normal pulses noted. Extremities:  Without clubbing or edema. Neurologic:  Alert and  oriented x4;  grossly normal neurologically. Skin:  Intact without significant lesions or rashes. Psych:  Alert and cooperative. Normal mood and affect.   Lab Results: Recent Labs    07/01/2022 0624  WBC 13.0*  HGB 16.6  HCT 52.8*  PLT 273   BMET Recent Labs    07/02/2022 0643  NA 138  K 3.4*  CL 100  CO2 22  GLUCOSE 252*  BUN 23  CREATININE 1.70*  CALCIUM 7.2*   LFT Recent Labs    07/17/2022 0643  PROT 7.7  ALBUMIN 4.0  AST 93*  ALT 74*  ALKPHOS 71  BILITOT 0.8   PT/INR No results for input(s): "LABPROT", "INR" in the last 72 hours.  Studies/Results: CT ABDOMEN PELVIS WO CONTRAST  Result Date: 06/30/2022 CLINICAL DATA:  Peritonitis or perforation suspected. EXAM: CT ABDOMEN AND PELVIS WITHOUT CONTRAST TECHNIQUE: Multidetector CT imaging of the abdomen and pelvis was performed following the standard protocol  without IV contrast. RADIATION DOSE REDUCTION: This exam was performed according to the departmental dose-optimization program which includes automated exposure control, adjustment of the mA and/or kV according to patient size and/or use of iterative reconstruction technique. COMPARISON:  CT abdomen/pelvis 12/18/2017. FINDINGS: Lower chest: Small left pleural effusion with adjacent atelectasis in the left lung base. Coronary artery calcifications. Hepatobiliary: Hepatic steatosis. Unchanged benign cysts in both lobes of the liver. Mild perihepatic ascites. No biliary dilatation. Gallbladder is unremarkable. Pancreas: Extensive edema and peripancreatic fat stranding, consistent with acute pancreatitis. No organized peripancreatic fluid collection. Spleen: Mild perisplenic ascites. Adrenals/Urinary Tract: Adrenal glands are unremarkable. Unchanged bilateral simple renal cysts. No nephrolithiasis or hydronephrosis. Within limits of metal artifact from right total hip arthroplasty, bladder is unremarkable. Stomach/Bowel: Inflammatory fat stranding and reactive wall thickening of the duodenum. No dilated loops of small bowel. Normal appendix is visualized on axial image 61 series 3. Mild wall thickening and surrounding fat stranding along the hepatic and splenic flexures, likely reactive in the setting of acute pancreatitis. Colon is otherwise unremarkable. Vascular/Lymphatic: Aortic atherosclerosis. No enlarged abdominal or pelvic lymph nodes. Reproductive: Prostate is unremarkable. Other: Small volume ascites.  No abdominal wall hernia. Musculoskeletal: Prior right total hip arthroplasty. Moderate degenerative changes of the left hip joint. IMPRESSION: 1. Extensive pancreatic edema and peripancreatic fat stranding, consistent with acute pancreatitis. No organized peripancreatic fluid collection. 2. Mild wall thickening and surrounding fat stranding along the hepatic and splenic flexures, likely reactive in the setting  of acute pancreatitis. 3. Small volume ascites. 4. Small left pleural effusion with adjacent atelectasis in the left lung base. Aortic Atherosclerosis (ICD10-I70.0). Electronically Signed   By: Orvan Falconer M.D.   On: 06/29/2022 08:42   DG Abd Acute W/Chest  Result Date: 07/14/2022 CLINICAL DATA:  Abdominal cramping since endoscopy EXAM: DG ABDOMEN ACUTE WITH 1 VIEW CHEST COMPARISON:  07/25/2022 FINDINGS: No focal consolidation. No pleural effusion or pneumothorax. Heart and mediastinal contours  Current Outpatient Medications  Medication Sig Dispense Refill   aspirin 325 MG tablet Take 325 mg by mouth 2 (two) times daily.     atorvastatin (LIPITOR) 40 MG tablet Take 40 mg by mouth daily.     cyclobenzaprine (FLEXERIL) 10 MG tablet Take 5 mg by mouth every 6 (six) hours as needed for muscle spasms.     docusate sodium (COLACE) 100 MG capsule Take 100 mg by mouth daily.     HYDROcodone-acetaminophen (NORCO/VICODIN) 5-325 MG tablet Take 1 tablet by mouth every 4 (four) hours as needed for moderate pain.     leuprolide, 6 Month, (ELIGARD) 45 MG injection Inject 45 mg into the skin every 6 (six) months.      meloxicam (MOBIC) 15 MG tablet Take 15 mg by mouth daily.     metFORMIN (GLUCOPHAGE) 1000 MG tablet Take 1,000 mg by mouth 2 (two) times daily with a meal.     metFORMIN (GLUCOPHAGE) 500 MG tablet Take 1 tablet (500 mg total) by mouth 2 (two) times daily with a meal. (Patient not taking: Reported on 12/18/2017) 60 tablet 3   Multiple Vitamin (MULTIVITAMIN WITH MINERALS) TABS tablet Take 1 tablet by mouth daily.     omega-3 acid ethyl esters (LOVAZA) 1 G capsule Take 1 g by mouth daily.     omeprazole (PRILOSEC) 40 MG capsule Take 40 mg by mouth 2 (two) times daily.     oxyCODONE (OXY IR/ROXICODONE) 5 MG immediate release tablet Take 5 mg by mouth every 4 (four) hours as needed for severe pain.     potassium chloride SA (K-DUR,KLOR-CON) 20 MEQ tablet Take 40 mEq by mouth 2 (two) times daily.     pregabalin (LYRICA) 75 MG capsule Take 75 mg by mouth 2 (two) times daily.     triamterene-hydrochlorothiazide (MAXZIDE) 75-50 MG per tablet Take 0.5 tablets by mouth daily.      Allergies as of 07/27/2022   (No Known Allergies)    History reviewed. No pertinent family history.  Social History   Socioeconomic History   Marital status: Married    Spouse name: Not on file   Number of children: Not on file   Years of education: Not on file   Highest education level: Not on file  Occupational History   Not on file  Tobacco Use   Smoking status: Former    Types: Cigarettes    Quit date: 08/23/2003    Years since quitting: 18.9   Smokeless tobacco: Never  Substance and Sexual Activity   Alcohol use: Yes    Comment: occ   Drug use: No   Sexual activity: Not on file  Other Topics Concern   Not on file  Social History Narrative   Not on file   Social Determinants of Health   Financial Resource Strain: Not on file  Food Insecurity: Not on file  Transportation Needs: Not on file  Physical Activity: Not on file  Stress: Not on file  Social Connections: Not on file  Intimate Partner  Violence: Not on file    Review of Systems: As per HPI, all others negative  Physical Exam: Vital signs in last 24 hours: Temp:  [97.5 F (36.4 C)-98.2 F (36.8 C)] 98.2 F (36.8 C) (06/19 0950) Pulse Rate:  [100-116] 105 (06/19 0920) Resp:  [18-35] 29 (06/19 0920) BP: (158-200)/(103-125) 169/103 (06/19 0920) SpO2:  [96 %] 96 % (06/19 0920) Weight:  [90.7 kg] 90.7 kg (06/19 0558)   General:   Alert,  uncomfortable-appearing and cooperative in NAD Head:  Normocephalic and atraumatic. Eyes:  Sclera clear, no icterus.   Conjunctiva pink. Ears:  Normal auditory acuity. Nose:  No deformity, discharge,  or lesions. Mouth:  No deformity or lesions.  Oropharynx pink & moist. Neck:  Supple; no masses or thyromegaly. Lungs:  No respiratory distress Abdomen:  Moderate tenderness epigastric with voluntary guarding. No masses, hepatosplenomegaly or hernias noted. without guarding, and without rebound.     Msk:  Symmetrical without gross deformities. Normal posture. Pulses:  Normal pulses noted. Extremities:  Without clubbing or edema. Neurologic:  Alert and  oriented x4;  grossly normal neurologically. Skin:  Intact without significant lesions or rashes. Psych:  Alert and cooperative. Normal mood and affect.   Lab Results: Recent Labs    07/01/2022 0624  WBC 13.0*  HGB 16.6  HCT 52.8*  PLT 273   BMET Recent Labs    07/02/2022 0643  NA 138  K 3.4*  CL 100  CO2 22  GLUCOSE 252*  BUN 23  CREATININE 1.70*  CALCIUM 7.2*   LFT Recent Labs    07/17/2022 0643  PROT 7.7  ALBUMIN 4.0  AST 93*  ALT 74*  ALKPHOS 71  BILITOT 0.8   PT/INR No results for input(s): "LABPROT", "INR" in the last 72 hours.  Studies/Results: CT ABDOMEN PELVIS WO CONTRAST  Result Date: 06/30/2022 CLINICAL DATA:  Peritonitis or perforation suspected. EXAM: CT ABDOMEN AND PELVIS WITHOUT CONTRAST TECHNIQUE: Multidetector CT imaging of the abdomen and pelvis was performed following the standard protocol  without IV contrast. RADIATION DOSE REDUCTION: This exam was performed according to the departmental dose-optimization program which includes automated exposure control, adjustment of the mA and/or kV according to patient size and/or use of iterative reconstruction technique. COMPARISON:  CT abdomen/pelvis 12/18/2017. FINDINGS: Lower chest: Small left pleural effusion with adjacent atelectasis in the left lung base. Coronary artery calcifications. Hepatobiliary: Hepatic steatosis. Unchanged benign cysts in both lobes of the liver. Mild perihepatic ascites. No biliary dilatation. Gallbladder is unremarkable. Pancreas: Extensive edema and peripancreatic fat stranding, consistent with acute pancreatitis. No organized peripancreatic fluid collection. Spleen: Mild perisplenic ascites. Adrenals/Urinary Tract: Adrenal glands are unremarkable. Unchanged bilateral simple renal cysts. No nephrolithiasis or hydronephrosis. Within limits of metal artifact from right total hip arthroplasty, bladder is unremarkable. Stomach/Bowel: Inflammatory fat stranding and reactive wall thickening of the duodenum. No dilated loops of small bowel. Normal appendix is visualized on axial image 61 series 3. Mild wall thickening and surrounding fat stranding along the hepatic and splenic flexures, likely reactive in the setting of acute pancreatitis. Colon is otherwise unremarkable. Vascular/Lymphatic: Aortic atherosclerosis. No enlarged abdominal or pelvic lymph nodes. Reproductive: Prostate is unremarkable. Other: Small volume ascites.  No abdominal wall hernia. Musculoskeletal: Prior right total hip arthroplasty. Moderate degenerative changes of the left hip joint. IMPRESSION: 1. Extensive pancreatic edema and peripancreatic fat stranding, consistent with acute pancreatitis. No organized peripancreatic fluid collection. 2. Mild wall thickening and surrounding fat stranding along the hepatic and splenic flexures, likely reactive in the setting  of acute pancreatitis. 3. Small volume ascites. 4. Small left pleural effusion with adjacent atelectasis in the left lung base. Aortic Atherosclerosis (ICD10-I70.0). Electronically Signed   By: Orvan Falconer M.D.   On: 06/29/2022 08:42   DG Abd Acute W/Chest  Result Date: 07/14/2022 CLINICAL DATA:  Abdominal cramping since endoscopy EXAM: DG ABDOMEN ACUTE WITH 1 VIEW CHEST COMPARISON:  07/25/2022 FINDINGS: No focal consolidation. No pleural effusion or pneumothorax. Heart and mediastinal contours  Current Outpatient Medications  Medication Sig Dispense Refill   aspirin 325 MG tablet Take 325 mg by mouth 2 (two) times daily.     atorvastatin (LIPITOR) 40 MG tablet Take 40 mg by mouth daily.     cyclobenzaprine (FLEXERIL) 10 MG tablet Take 5 mg by mouth every 6 (six) hours as needed for muscle spasms.     docusate sodium (COLACE) 100 MG capsule Take 100 mg by mouth daily.     HYDROcodone-acetaminophen (NORCO/VICODIN) 5-325 MG tablet Take 1 tablet by mouth every 4 (four) hours as needed for moderate pain.     leuprolide, 6 Month, (ELIGARD) 45 MG injection Inject 45 mg into the skin every 6 (six) months.      meloxicam (MOBIC) 15 MG tablet Take 15 mg by mouth daily.     metFORMIN (GLUCOPHAGE) 1000 MG tablet Take 1,000 mg by mouth 2 (two) times daily with a meal.     metFORMIN (GLUCOPHAGE) 500 MG tablet Take 1 tablet (500 mg total) by mouth 2 (two) times daily with a meal. (Patient not taking: Reported on 12/18/2017) 60 tablet 3   Multiple Vitamin (MULTIVITAMIN WITH MINERALS) TABS tablet Take 1 tablet by mouth daily.     omega-3 acid ethyl esters (LOVAZA) 1 G capsule Take 1 g by mouth daily.     omeprazole (PRILOSEC) 40 MG capsule Take 40 mg by mouth 2 (two) times daily.     oxyCODONE (OXY IR/ROXICODONE) 5 MG immediate release tablet Take 5 mg by mouth every 4 (four) hours as needed for severe pain.     potassium chloride SA (K-DUR,KLOR-CON) 20 MEQ tablet Take 40 mEq by mouth 2 (two) times daily.     pregabalin (LYRICA) 75 MG capsule Take 75 mg by mouth 2 (two) times daily.     triamterene-hydrochlorothiazide (MAXZIDE) 75-50 MG per tablet Take 0.5 tablets by mouth daily.      Allergies as of 07/27/2022   (No Known Allergies)    History reviewed. No pertinent family history.  Social History   Socioeconomic History   Marital status: Married    Spouse name: Not on file   Number of children: Not on file   Years of education: Not on file   Highest education level: Not on file  Occupational History   Not on file  Tobacco Use   Smoking status: Former    Types: Cigarettes    Quit date: 08/23/2003    Years since quitting: 18.9   Smokeless tobacco: Never  Substance and Sexual Activity   Alcohol use: Yes    Comment: occ   Drug use: No   Sexual activity: Not on file  Other Topics Concern   Not on file  Social History Narrative   Not on file   Social Determinants of Health   Financial Resource Strain: Not on file  Food Insecurity: Not on file  Transportation Needs: Not on file  Physical Activity: Not on file  Stress: Not on file  Social Connections: Not on file  Intimate Partner  Violence: Not on file    Review of Systems: As per HPI, all others negative  Physical Exam: Vital signs in last 24 hours: Temp:  [97.5 F (36.4 C)-98.2 F (36.8 C)] 98.2 F (36.8 C) (06/19 0950) Pulse Rate:  [100-116] 105 (06/19 0920) Resp:  [18-35] 29 (06/19 0920) BP: (158-200)/(103-125) 169/103 (06/19 0920) SpO2:  [96 %] 96 % (06/19 0920) Weight:  [90.7 kg] 90.7 kg (06/19 0558)   General:   Alert,

## 2022-07-16 NOTE — ED Notes (Signed)
Pt is resting at this time

## 2022-07-16 NOTE — ED Notes (Addendum)
ED TO INPATIENT HANDOFF REPORT  Name/Age/Gender Richard Whitaker 77 y.o. male  Code Status    Code Status Orders  (From admission, onward)           Start     Ordered   07/16/22 1025  Full code  Continuous       Question:  By:  Answer:  Consent: discussion documented in EHR   07/16/22 1026           Code Status History     Date Active Date Inactive Code Status Order ID Comments User Context   12/18/2017 1319 12/26/2017 1127 Full Code [id number]  Jerre Simon, PA ED       Home/SNF/Other    Chief Complaint Acute pancreatitis [K85.90]  Level of Care/Admitting Diagnosis ED Disposition     ED Disposition  Admit   Condition  --   Comment  Hospital Area: [facility] [[id number]]  Level of Care: Med-Surg [16]  May admit patient to [facility] or [facility] if equivalent level of care is available:: Yes  Covid Evaluation: Asymptomatic - no recent exposure (last 10 days) testing not required  Diagnosis: Acute pancreatitis [577.0.ICD-9-CM]  Admitting Physician: Maryln Gottron [[id number]]  Attending Physician: Kirby Crigler, MIR [email or web address] [[id number]]  Certification:: I certify this patient will need inpatient services for at least 2 midnights  Estimated Length of Stay: 3          Medical History Past Medical History:  Diagnosis Date   Arthritis    DM (diabetes mellitus) (HCC)    Hypertension    Prostate cancer (HCC)    Reflux     Allergies No Known Allergies  IV Location/Drains/Wounds Patient Lines/Drains/Airways Status     Active Line/Drains/Airways     Name Placement date Placement time Site Days   Peripheral IV 07/16/22 18 G Left Antecubital 07/16/22  0557  Antecubital  less than 1   Incision (Closed) 12/16/17 Hip Anterior;Right 12/16/17  --  -- 1673            Labs/Imaging Results for orders placed or performed during the hospital encounter of 07/16/22 (from the past 48 hour(s))  CBG monitoring, ED     Status: Abnormal    Collection Time: 07/16/22  6:02 AM  Result Value Ref Range   Glucose-Capillary 271 (H) 70 - 99 mg/dL    Comment: Glucose reference range applies only to samples taken after fasting for at least 8 hours.  CBC with Differential     Status: Abnormal   Collection Time: 07/16/22  6:24 AM  Result Value Ref Range   WBC 13.0 (H) 4.0 - 10.5 K/uL   RBC 6.48 (H) 4.22 - 5.81 MIL/uL   Hemoglobin 16.6 13.0 - 17.0 g/dL   HCT 62.1 (H) 30.8 - 65.7 %   MCV 81.5 80.0 - 100.0 fL   MCH 25.6 (L) 26.0 - 34.0 pg   MCHC 31.4 30.0 - 36.0 g/dL   RDW 84.6 96.2 - 95.2 %   Platelets 273 150 - 400 K/uL   nRBC 0.0 0.0 - 0.2 %   Neutrophils Relative % 85 %   Neutro Abs 11.0 (H) 1.7 - 7.7 K/uL   Lymphocytes Relative 9 %   Lymphs Abs 1.1 0.7 - 4.0 K/uL   Monocytes Relative 6 %   Monocytes Absolute 0.8 0.1 - 1.0 K/uL   Eosinophils Relative 0 %   Eosinophils Absolute 0.0 0.0 - 0.5 K/uL   Basophils Relative 0 %  Basophils Absolute 0.0 0.0 - 0.1 K/uL   Immature Granulocytes 0 %   Abs Immature Granulocytes 0.04 0.00 - 0.07 K/uL    Comment: Performed at [facility], [address], [place], [place] [address]  Lipase, blood     Status: Abnormal   Collection Time: 07/16/22  6:43 AM  Result Value Ref Range   Lipase 1,765 (H) 11 - 51 U/L    Comment: RESULT CONFIRMED BY MANUAL DILUTION Performed at [facility], [address], [place], [place] [address]   Comprehensive metabolic panel     Status: Abnormal   Collection Time: 07/16/22  6:43 AM  Result Value Ref Range   Sodium 138 135 - 145 mmol/L   Potassium 3.4 (L) 3.5 - 5.1 mmol/L   Chloride 100 98 - 111 mmol/L   CO2 22 22 - 32 mmol/L   Glucose, Bld 252 (H) 70 - 99 mg/dL    Comment: Glucose reference range applies only to samples taken after fasting for at least 8 hours.   BUN 23 8 - 23 mg/dL   Creatinine, Ser 1.47 (H) 0.61 - 1.24 mg/dL   Calcium 7.2 (L) 8.9 - 10.3 mg/dL   Total Protein 7.7 6.5 - 8.1 g/dL   Albumin 4.0  3.5 - 5.0 g/dL   AST 93 (H) 15 - 41 U/L   ALT 74 (H) 0 - 44 U/L    Comment: RESULT CONFIRMED BY MANUAL DILUTION   Alkaline Phosphatase 71 38 - 126 U/L   Total Bilirubin 0.8 0.3 - 1.2 mg/dL   GFR, Estimated 42 (L) >60 mL/min    Comment: (NOTE) Calculated using the CKD-EPI Creatinine Equation (2021)    Anion gap 16 (H) 5 - 15    Comment: Performed at [facility], [address], [place], [place] [address]  Troponin I (High Sensitivity)     Status: Abnormal   Collection Time: 07/16/22  6:43 AM  Result Value Ref Range   Troponin I (High Sensitivity) 54 (H) <18 ng/L    Comment: (NOTE) Elevated high sensitivity troponin I (hsTnI) values and significant  changes across serial measurements may suggest ACS but many other  chronic and acute conditions are known to elevate hsTnI results.  Refer to the "Links" section for chest pain algorithms and additional  guidance. Performed at [facility], [address] [place], [place] [address]   Urinalysis, Routine w reflex microscopic -Urine, Clean Catch     Status: Abnormal   Collection Time: 07/16/22  6:44 AM  Result Value Ref Range   Color, Urine YELLOW YELLOW   APPearance HAZY (A) CLEAR   Specific Gravity, Urine 1.019 1.005 - 1.030   pH 5.0 5.0 - 8.0   Glucose, UA 50 (A) NEGATIVE mg/dL   Hgb urine dipstick MODERATE (A) NEGATIVE   Bilirubin Urine NEGATIVE NEGATIVE   Ketones, ur NEGATIVE NEGATIVE mg/dL   Protein, ur 657 (A) NEGATIVE mg/dL   Nitrite NEGATIVE NEGATIVE   Leukocytes,Ua NEGATIVE NEGATIVE   RBC / HPF 0-5 0 - 5 RBC/hpf   WBC, UA 0-5 0 - 5 WBC/hpf   Bacteria, UA RARE (A) NONE SEEN   Squamous Epithelial / HPF 0-5 0 - 5 /HPF   Mucus PRESENT    Hyaline Casts, UA PRESENT     Comment: Performed at [facility] [place] [facility], [address] [place], [place] [address]  Lactic acid, plasma     Status: Abnormal   Collection Time: 07/16/22  6:50 AM  Result Value Ref Range   Lactic Acid,  Venous 4.5 (HH) 0.5 - 1.9 mmol/L    Comment: CRITICAL RESULT CALLED TO, READ BACK BY AND VERIFIED WITH Andrey Campanile, A. RN AT [phone] ON 07/16/2022 BY MECIAL J. Performed at [facility], [address], [place], [place] [address]   Troponin I (High Sensitivity)     Status: Abnormal   Collection Time: 07/16/22  8:43 AM  Result Value Ref Range   Troponin I (High Sensitivity) 56 (H) <18 ng/L    Comment: (NOTE) Elevated high sensitivity troponin I (hsTnI) values and significant  changes across serial measurements may suggest ACS but many other  chronic and acute conditions are known to elevate hsTnI results.  Refer to the "Links" section for chest pain algorithms and additional  guidance. Performed at [facility], [address] [place], [place] [address]   Lactic acid, plasma     Status: Abnormal   Collection Time: 07/16/22  8:44 AM  Result Value Ref Range   Lactic Acid, Venous 4.5 (HH) 0.5 - 1.9 mmol/L    Comment: CRITICAL VALUE NOTED. VALUE IS CONSISTENT WITH PREVIOUSLY REPORTED/CALLED VALUE Performed at [facility], [address] [place] [address] [place], [place] [address]    CT ABDOMEN PELVIS WO CONTRAST  Result Date: 07/16/2022 CLINICAL DATA:  Peritonitis or perforation suspected. EXAM: CT ABDOMEN AND PELVIS WITHOUT CONTRAST TECHNIQUE: Multidetector CT imaging of the abdomen and pelvis was performed following the standard protocol without IV contrast. RADIATION DOSE REDUCTION: This exam was performed according to the departmental dose-optimization program which includes automated exposure control, adjustment of the mA and/or kV according to patient size and/or use of iterative reconstruction technique. COMPARISON:  CT abdomen/pelvis 12/18/2017. FINDINGS: Lower chest: Small left pleural effusion with adjacent atelectasis in the left lung base. Coronary artery calcifications. Hepatobiliary: Hepatic steatosis. Unchanged benign cysts in both lobes of the  liver. Mild perihepatic ascites. No biliary dilatation. Gallbladder is unremarkable. Pancreas: Extensive edema and peripancreatic fat stranding, consistent with acute pancreatitis. No organized peripancreatic fluid collection. Spleen: Mild perisplenic ascites. Adrenals/Urinary Tract: Adrenal glands are unremarkable. Unchanged bilateral simple renal cysts. No nephrolithiasis or hydronephrosis. Within limits of metal artifact from right total hip arthroplasty, bladder is unremarkable. Stomach/Bowel: Inflammatory fat stranding and reactive wall thickening of the duodenum. No dilated loops of small bowel. Normal appendix is visualized on axial image 61 series 3. Mild wall thickening and surrounding fat stranding along the hepatic and splenic flexures, likely reactive in the setting of acute pancreatitis. Colon is otherwise unremarkable. Vascular/Lymphatic: Aortic atherosclerosis. No enlarged abdominal or pelvic lymph nodes. Reproductive: Prostate is unremarkable. Other: Small volume ascites.  No abdominal wall hernia. Musculoskeletal: Prior right total hip arthroplasty. Moderate degenerative changes of the left hip joint. IMPRESSION: 1. Extensive pancreatic edema and peripancreatic fat stranding, consistent with acute pancreatitis. No organized peripancreatic fluid collection. 2. Mild wall thickening and surrounding fat stranding along the hepatic and splenic flexures, likely reactive in the setting of acute pancreatitis. 3. Small volume ascites. 4. Small left pleural effusion with adjacent atelectasis in the left lung base. Aortic Atherosclerosis (ICD10-I70.0). Electronically Signed   By: Orvan Falconer M.D.   On: 07/16/2022 08:42   DG Abd Acute W/Chest  Result Date: 07/16/2022 CLINICAL DATA:  Abdominal cramping since endoscopy EXAM: DG ABDOMEN ACUTE WITH 1 VIEW CHEST COMPARISON:  07/16/2022 FINDINGS: No focal consolidation. No pleural effusion or pneumothorax. Heart and mediastinal contours are unremarkable.  Gaseous distension of the transverse colon.  No bowel dilatation to suggest obstruction. No air-fluid levels. No pneumoperitoneum, portal venous gas, or pneumatosis. No urolithiasis. No acute osseous abnormality. IMPRESSION: Negative abdominal radiographs.  No acute cardiopulmonary disease. Electronically Signed   By: Elige Ko M.D.   On: 07/16/2022 07:59   DG Chest Port 1 View  Result Date: 07/16/2022 CLINICAL DATA:  Syncope and vomiting. EXAM: PORTABLE CHEST 1 VIEW COMPARISON:  12/18/2017 FINDINGS: Low volume film. The cardiopericardial silhouette is within normal limits for size. Vascular crowding at the bases. No focal consolidation or airspace pulmonary edema. No pleural effusion. No acute bony abnormality. IMPRESSION: Low volume film without acute cardiopulmonary findings. Electronically Signed   By: Kennith Center M.D.   On: 07/16/2022 06:34    Pending Labs Unresulted Labs (From admission, onward)     Start     Ordered   07/17/22 0500  Comprehensive metabolic panel  Tomorrow morning,   R        07/16/22 1026   07/17/22 0500  CBC  Tomorrow morning,   R        07/16/22 1026   07/16/22 1025  Hemoglobin A1c  (Glycemic Control (SSI)  Q 4 Hours / Glycemic Control (SSI)  AC +/- HS)  Once,   R       Comments: To assess prior glycemic control    07/16/22 1026            Vitals/Pain Today's Vitals   07/16/22 0920 07/16/22 0950 07/16/22 0957 07/16/22 1020  BP: (!) 169/103   (!) 161/113  Pulse: (!) 105   99  Resp: (!) 29   (!) 34  Temp:  98.2 F (36.8 C)    TempSrc:  Oral    SpO2: 96%   97%  Weight:      Height:      PainSc:   9      Isolation Precautions No active isolations  Medications Medications  insulin aspart (novoLOG) injection 0-15 Units (has no administration in time range)  insulin aspart (novoLOG) injection 0-5 Units (has no administration in time range)  heparin injection 5,000 Units (has no administration in time range)  0.9 % NaCl with KCl 20 mEq/ L   infusion (has no administration in time range)  acetaminophen (TYLENOL) tablet 650 mg (has no administration in time range)    Or  acetaminophen (TYLENOL) suppository 650 mg (has no administration in time range)  oxyCODONE (Oxy IR/ROXICODONE) immediate release tablet 5 mg (has no administration in time range)  HYDROmorphone (DILAUDID) injection 0.5-1 mg (has no administration in time range)  docusate sodium (COLACE) capsule 100 mg (has no administration in time range)  polyethylene glycol (MIRALAX / GLYCOLAX) packet 17 g (has no administration in time range)  ondansetron (ZOFRAN) tablet 4 mg (has no administration in time range)    Or  ondansetron (ZOFRAN) injection 4 mg (has no administration in time range)  albuterol (PROVENTIL) (2.5 MG/3ML) 0.083% nebulizer solution 2.5 mg (has no administration in time range)  metoprolol tartrate (LOPRESSOR) injection 5 mg (has no administration in time range)  pantoprazole (PROTONIX) injection 40 mg (has no administration in time range)  sodium chloride 0.9 % bolus 1,000 mL (0 mLs Intravenous Stopped 07/16/22 0948)  piperacillin-tazobactam (ZOSYN) IVPB 3.375 g (0 g Intravenous Stopped 07/16/22 0822)  morphine (PF) 4 MG/ML injection 4 mg (4 mg Intravenous Given 07/16/22 0820)  0.9 %  sodium chloride infusion ( Intravenous New Bag/Given 07/16/22 0949)  pantoprazole (PROTONIX) injection 40 mg (40 mg Intravenous  Given 07/16/22 1023)  sodium chloride 0.9 % bolus 1,000 mL (1,000 mLs Intravenous New Bag/Given 07/16/22 1022)  morphine (PF) 4 MG/ML injection 4 mg (4 mg Intravenous Given 07/16/22 1022)    Mobility walks

## 2022-07-16 NOTE — ED Triage Notes (Signed)
Patient brought in by [facility], reports having endoscopy yesterday morning and since then having abd cramping and n/v. Per EMS, wife witnessed patient have syncopal episode while on toilet this AM. Glucose 300, patient doesn't remember if he took his metformin yesterday due to procedure.

## 2022-07-16 NOTE — ED Notes (Signed)
Called [facility] and they are not sure who is taking pt at this time.  Room # w/o purple man showing.\

## 2022-07-17 DIAGNOSIS — K851 Biliary acute pancreatitis without necrosis or infection: Secondary | ICD-10-CM | POA: Diagnosis not present

## 2022-07-17 DIAGNOSIS — N179 Acute kidney failure, unspecified: Secondary | ICD-10-CM

## 2022-07-17 DIAGNOSIS — G9341 Metabolic encephalopathy: Secondary | ICD-10-CM

## 2022-07-17 DIAGNOSIS — E872 Acidosis, unspecified: Secondary | ICD-10-CM

## 2022-07-17 DIAGNOSIS — R7989 Other specified abnormal findings of blood chemistry: Secondary | ICD-10-CM

## 2022-07-17 DIAGNOSIS — E875 Hyperkalemia: Secondary | ICD-10-CM | POA: Diagnosis not present

## 2022-07-17 DIAGNOSIS — R748 Abnormal levels of other serum enzymes: Secondary | ICD-10-CM

## 2022-07-17 DIAGNOSIS — E7220 Disorder of urea cycle metabolism, unspecified: Secondary | ICD-10-CM

## 2022-07-17 LAB — COMPREHENSIVE METABOLIC PANEL
ALT: 63 U/L — ABNORMAL HIGH (ref 0–44)
ALT: 134 U/L — ABNORMAL HIGH (ref 0–44)
ALT: 223 U/L — ABNORMAL HIGH (ref 0–44)
ALT: 276 U/L — ABNORMAL HIGH (ref 0–44)
ALT: 278 U/L — ABNORMAL HIGH (ref 0–44)
AST: 105 U/L — ABNORMAL HIGH (ref 15–41)
AST: 191 U/L — ABNORMAL HIGH (ref 15–41)
AST: 407 U/L — ABNORMAL HIGH (ref 15–41)
AST: 567 U/L — ABNORMAL HIGH (ref 15–41)
AST: 647 U/L — ABNORMAL HIGH (ref 15–41)
Albumin: 3.2 g/dL — ABNORMAL LOW (ref 3.5–5.0)
Albumin: 3.2 g/dL — ABNORMAL LOW (ref 3.5–5.0)
Albumin: 2.8 g/dL — ABNORMAL LOW (ref 3.5–5.0)
Albumin: 2.9 g/dL — ABNORMAL LOW (ref 3.5–5.0)
Albumin: 2.6 g/dL — ABNORMAL LOW (ref 3.5–5.0)
Alkaline Phosphatase: 55 U/L (ref 38–126)
Alkaline Phosphatase: 57 U/L (ref 38–126)
Alkaline Phosphatase: 52 U/L (ref 38–126)
Alkaline Phosphatase: 52 U/L (ref 38–126)
Alkaline Phosphatase: 50 U/L (ref 38–126)
Anion gap: 12 (ref 5–15)
Anion gap: 11 (ref 5–15)
Anion gap: 11 (ref 5–15)
Anion gap: 11 (ref 5–15)
Anion gap: 15 (ref 5–15)
BUN: 34 mg/dL — ABNORMAL HIGH (ref 8–23)
BUN: 38 mg/dL — ABNORMAL HIGH (ref 8–23)
BUN: 42 mg/dL — ABNORMAL HIGH (ref 8–23)
BUN: 46 mg/dL — ABNORMAL HIGH (ref 8–23)
BUN: 48 mg/dL — ABNORMAL HIGH (ref 8–23)
CO2: 13 mmol/L — ABNORMAL LOW (ref 22–32)
CO2: 15 mmol/L — ABNORMAL LOW (ref 22–32)
CO2: 17 mmol/L — ABNORMAL LOW (ref 22–32)
CO2: 14 mmol/L — ABNORMAL LOW (ref 22–32)
CO2: 15 mmol/L — ABNORMAL LOW (ref 22–32)
Calcium: 4.8 mg/dL — CL (ref 8.9–10.3)
Calcium: 4.5 mg/dL — CL (ref 8.9–10.3)
Calcium: 4.4 mg/dL — CL (ref 8.9–10.3)
Calcium: 4.7 mg/dL — CL (ref 8.9–10.3)
Calcium: 5.1 mg/dL — CL (ref 8.9–10.3)
Chloride: 115 mmol/L — ABNORMAL HIGH (ref 98–111)
Chloride: 115 mmol/L — ABNORMAL HIGH (ref 98–111)
Chloride: 115 mmol/L — ABNORMAL HIGH (ref 98–111)
Chloride: 117 mmol/L — ABNORMAL HIGH (ref 98–111)
Chloride: 115 mmol/L — ABNORMAL HIGH (ref 98–111)
Creatinine, Ser: 3.04 mg/dL — ABNORMAL HIGH (ref 0.61–1.24)
Creatinine, Ser: 3.51 mg/dL — ABNORMAL HIGH (ref 0.61–1.24)
Creatinine, Ser: 3.91 mg/dL — ABNORMAL HIGH (ref 0.61–1.24)
Creatinine, Ser: 4.16 mg/dL — ABNORMAL HIGH (ref 0.61–1.24)
Creatinine, Ser: 4.54 mg/dL — ABNORMAL HIGH (ref 0.61–1.24)
GFR, Estimated: 21 mL/min — ABNORMAL LOW (ref >60)
GFR, Estimated: 18 mL/min — ABNORMAL LOW (ref >60)
GFR, Estimated: 15 mL/min — ABNORMAL LOW (ref >60)
GFR, Estimated: 14 mL/min — ABNORMAL LOW (ref >60)
GFR, Estimated: 13 mL/min — ABNORMAL LOW (ref >60)
Glucose, Bld: 186 mg/dL — ABNORMAL HIGH (ref 70–99)
Glucose, Bld: 184 mg/dL — ABNORMAL HIGH (ref 70–99)
Glucose, Bld: 171 mg/dL — ABNORMAL HIGH (ref 70–99)
Glucose, Bld: 155 mg/dL — ABNORMAL HIGH (ref 70–99)
Glucose, Bld: 140 mg/dL — ABNORMAL HIGH (ref 70–99)
Potassium: 6.5 mmol/L (ref 3.5–5.1)
Potassium: 5.8 mmol/L — ABNORMAL HIGH (ref 3.5–5.1)
Potassium: 6.2 mmol/L — ABNORMAL HIGH (ref 3.5–5.1)
Potassium: 5.1 mmol/L (ref 3.5–5.1)
Potassium: 4.7 mmol/L (ref 3.5–5.1)
Sodium: 140 mmol/L (ref 135–145)
Sodium: 141 mmol/L (ref 135–145)
Sodium: 143 mmol/L (ref 135–145)
Sodium: 142 mmol/L (ref 135–145)
Sodium: 145 mmol/L (ref 135–145)
Total Bilirubin: 0.8 mg/dL (ref 0.3–1.2)
Total Bilirubin: 0.7 mg/dL (ref 0.3–1.2)
Total Bilirubin: 0.7 mg/dL (ref 0.3–1.2)
Total Bilirubin: 0.6 mg/dL (ref 0.3–1.2)
Total Bilirubin: 0.6 mg/dL (ref 0.3–1.2)
Total Protein: 6.1 g/dL — ABNORMAL LOW (ref 6.5–8.1)
Total Protein: 6.4 g/dL — ABNORMAL LOW (ref 6.5–8.1)
Total Protein: 5.7 g/dL — ABNORMAL LOW (ref 6.5–8.1)
Total Protein: 5.7 g/dL — ABNORMAL LOW (ref 6.5–8.1)
Total Protein: 5.2 g/dL — ABNORMAL LOW (ref 6.5–8.1)

## 2022-07-17 LAB — LIPID PANEL
Cholesterol: 106 mg/dL (ref 0–200)
HDL: 36 mg/dL — ABNORMAL LOW (ref >40)
LDL Cholesterol: 42 mg/dL (ref 0–99)
Total CHOL/HDL Ratio: 2.9 RATIO
Triglycerides: 138 mg/dL (ref <150)
VLDL: 28 mg/dL (ref 0–40)

## 2022-07-17 LAB — BASIC METABOLIC PANEL
Anion gap: 14 (ref 5–15)
BUN: 47 mg/dL — ABNORMAL HIGH (ref 8–23)
CO2: 14 mmol/L — ABNORMAL LOW (ref 22–32)
Calcium: 4.8 mg/dL — CL (ref 8.9–10.3)
Chloride: 116 mmol/L — ABNORMAL HIGH (ref 98–111)
Creatinine, Ser: 4.48 mg/dL — ABNORMAL HIGH (ref 0.61–1.24)
GFR, Estimated: 13 mL/min — ABNORMAL LOW (ref >60)
Glucose, Bld: 179 mg/dL — ABNORMAL HIGH (ref 70–99)
Potassium: 4.4 mmol/L (ref 3.5–5.1)
Sodium: 144 mmol/L (ref 135–145)

## 2022-07-17 LAB — AMMONIA: Ammonia: 40 umol/L — ABNORMAL HIGH (ref 9–35)

## 2022-07-17 LAB — TROPONIN I (HIGH SENSITIVITY)
Troponin I (High Sensitivity): 305 ng/L (ref <18)
Troponin I (High Sensitivity): 322 ng/L (ref <18)

## 2022-07-17 LAB — PHOSPHORUS: Phosphorus: 2.9 mg/dL (ref 2.5–4.6)

## 2022-07-17 LAB — CBC
HCT: 52.6 % — ABNORMAL HIGH (ref 39.0–52.0)
Hemoglobin: 15.9 g/dL (ref 13.0–17.0)
MCH: 25.8 pg — ABNORMAL LOW (ref 26.0–34.0)
MCHC: 30.2 g/dL (ref 30.0–36.0)
MCV: 85.3 fL (ref 80.0–100.0)
Platelets: 201 10*3/uL (ref 150–400)
RBC: 6.17 MIL/uL — ABNORMAL HIGH (ref 4.22–5.81)
RDW: 15.8 % — ABNORMAL HIGH (ref 11.5–15.5)
WBC: 20.9 10*3/uL — ABNORMAL HIGH (ref 4.0–10.5)
nRBC: 0 % (ref 0.0–0.2)

## 2022-07-17 LAB — BLOOD GAS, VENOUS
Acid-base deficit: 9.9 mmol/L — ABNORMAL HIGH (ref 0.0–2.0)
Bicarbonate: 18 mmol/L — ABNORMAL LOW (ref 20.0–28.0)
Drawn by: 35529
O2 Saturation: 37.5 %
Patient temperature: 36.8
pCO2, Ven: 46 mmHg (ref 44–60)
pH, Ven: 7.2 — ABNORMAL LOW (ref 7.25–7.43)
pO2, Ven: <31 mmHg — CL (ref 32–45)

## 2022-07-17 LAB — MAGNESIUM: Magnesium: 1.2 mg/dL — ABNORMAL LOW (ref 1.7–2.4)

## 2022-07-17 LAB — LACTIC ACID, PLASMA
Lactic Acid, Venous: 5 mmol/L (ref 0.5–1.9)
Lactic Acid, Venous: 4.4 mmol/L (ref 0.5–1.9)
Lactic Acid, Venous: 6.6 mmol/L (ref 0.5–1.9)
Lactic Acid, Venous: 4.7 mmol/L (ref 0.5–1.9)

## 2022-07-17 LAB — GLUCOSE, CAPILLARY
Glucose-Capillary: 159 mg/dL — ABNORMAL HIGH (ref 70–99)
Glucose-Capillary: 132 mg/dL — ABNORMAL HIGH (ref 70–99)
Glucose-Capillary: 157 mg/dL — ABNORMAL HIGH (ref 70–99)
Glucose-Capillary: 138 mg/dL — ABNORMAL HIGH (ref 70–99)

## 2022-07-17 LAB — LIPASE, BLOOD: Lipase: 982 U/L — ABNORMAL HIGH (ref 11–51)

## 2022-07-17 MED ORDER — SODIUM ZIRCONIUM CYCLOSILICATE 10 G PO PACK: 20.0000 g | PACK | Freq: Once | ORAL | Status: DC

## 2022-07-17 MED ORDER — SODIUM CHLORIDE 0.9 % IV BOLUS
1000.0000 mL | Freq: Once | INTRAVENOUS | Status: AC
  Administered 2022-07-17: 1000 mL via INTRAVENOUS

## 2022-07-17 MED ORDER — CALCIUM GLUCONATE-NACL 2-0.675 GM/100ML-% IV SOLN
2.0000 g | Freq: Once | INTRAVENOUS | Status: AC
  Administered 2022-07-17: 2000 mg via INTRAVENOUS
  Filled 2022-07-17: qty 100

## 2022-07-17 MED ORDER — STERILE WATER FOR INJECTION IV SOLN
INTRAVENOUS | Status: DC
  Filled 2022-07-17: qty 1000
  Filled 2022-07-17: qty 150
  Filled 2022-07-17: qty 1000
  Filled 2022-07-17 (×2): qty 150
  Filled 2022-07-17: qty 1000
  Filled 2022-07-17 (×2): qty 150

## 2022-07-17 MED ORDER — INSULIN ASPART 100 UNIT/ML IJ SOLN
10.0000 [IU] | Freq: Once | INTRAMUSCULAR | Status: AC
  Administered 2022-07-17: 10 [IU] via INTRAVENOUS

## 2022-07-17 MED ORDER — INSULIN ASPART 100 UNIT/ML IJ SOLN
0.0000 [IU] | INTRAMUSCULAR | Status: DC
  Administered 2022-07-17: 1 [IU] via SUBCUTANEOUS

## 2022-07-17 MED ORDER — SODIUM POLYSTYRENE SULFONATE 15 GM/60ML PO SUSP
60.0000 g | Freq: Once | ORAL | Status: DC
  Filled 2022-07-17: qty 240

## 2022-07-17 MED ORDER — DEXTROSE 50 % IV SOLN
25.0000 g | Freq: Once | INTRAVENOUS | Status: AC
  Administered 2022-07-17: 25 g via INTRAVENOUS
  Filled 2022-07-17: qty 50

## 2022-07-17 MED ORDER — SODIUM CHLORIDE 0.9 % IV SOLN
4.0000 g | Freq: Once | INTRAVENOUS | Status: DC
  Administered 2022-07-17: 4 g via INTRAVENOUS
  Filled 2022-07-17: qty 40

## 2022-07-17 MED ORDER — LACTULOSE ENEMA
300.0000 mL | Freq: Once | ORAL | Status: AC
  Administered 2022-07-17: 300 mL via RECTAL
  Filled 2022-07-17: qty 300

## 2022-07-17 MED ORDER — CALCIUM GLUCONATE-NACL 2-0.675 GM/100ML-% IV SOLN
2.0000 g | INTRAVENOUS | Status: AC
  Administered 2022-07-17 (×2): 2000 mg via INTRAVENOUS
  Filled 2022-07-17 (×2): qty 100

## 2022-07-17 MED ORDER — SODIUM POLYSTYRENE SULFONATE 15 GM/60ML PO SUSP
60.0000 g | Freq: Once | ORAL | Status: AC
  Administered 2022-07-17: 60 g via RECTAL

## 2022-07-17 MED ORDER — MAGNESIUM SULFATE 4 GM/100ML IV SOLN
4.0000 g | Freq: Once | INTRAVENOUS | Status: AC
  Administered 2022-07-17: 4 g via INTRAVENOUS
  Filled 2022-07-17: qty 100

## 2022-07-17 NOTE — Progress Notes (Signed)
Subjective: Agitated, now somnolent. Drop urine output.  Objective: Vital signs in last 24 hours: Temp:  [97.3 F (36.3 C)-98.6 F (37 C)] 97.8 F (36.6 C) (06/20 1150) Pulse Rate:  [105-134] 116 (06/20 0800) Resp:  [8-31] 10 (06/20 0800) BP: (95-143)/(56-94) 104/78 (06/20 0800) SpO2:  [88 %-99 %] 98 % (06/20 0800) Weight change:  Last BM Date : 07/17/22  PE: GEN:  Somnolent, non-conversant ABD:  Moderate distended.  Lab Results: CBC    Component Value Date/Time   WBC 20.9 (H) 07/17/2022 0302   RBC 6.17 (H) 07/17/2022 0302   HGB 15.9 07/17/2022 0302   HCT 52.6 (H) 07/17/2022 0302   PLT 201 07/17/2022 0302   MCV 85.3 07/17/2022 0302   MCH 25.8 (L) 07/17/2022 0302   MCHC 30.2 07/17/2022 0302   RDW 15.8 (H) 07/17/2022 0302   LYMPHSABS 1.1 07/16/2022 0624   MONOABS 0.8 07/16/2022 0624   EOSABS 0.0 07/16/2022 0624   BASOSABS 0.0 07/16/2022 0624  CMP     Component Value Date/Time   NA 142 07/17/2022 1513   K 5.1 07/17/2022 1513   CL 117 (H) 07/17/2022 1513   CO2 14 (L) 07/17/2022 1513   GLUCOSE 155 (H) 07/17/2022 1513   BUN 46 (H) 07/17/2022 1513   CREATININE 4.16 (H) 07/17/2022 1513   CALCIUM 4.7 (LL) 07/17/2022 1513   PROT 5.7 (L) 07/17/2022 1513   ALBUMIN 2.9 (L) 07/17/2022 1513   AST 567 (H) 07/17/2022 1513   ALT 276 (H) 07/17/2022 1513   ALKPHOS 52 07/17/2022 1513   BILITOT 0.6 07/17/2022 1513   GFRNONAA 14 (L) 07/17/2022 1513     Assessment:   Acute pancreatitis.  Suspect from ampullary biopsies or cautery during recent endoscopy with duodenal polypectomies. Progressive hypocalcemia and acute renal insufficiency.  Poor prognostic indicators.  Plan:   Nephrology consultation. IVF is key (diuresis not advised from GI/pancreatic perspective). Judicious analgesics. Supportive care. Clear liquids. [facility] will follow. Discussed case with wife, including guarded prognosis.   Richard Whitaker 07/17/2022, 4:27 PM   Cell [phone] If no answer  or after 5 PM call [phone]

## 2022-07-17 NOTE — Assessment & Plan Note (Addendum)
-   In setting of liver dysfunction - NH3 improved after lactulose enema

## 2022-07-17 NOTE — Assessment & Plan Note (Addendum)
-   elevated due to worsening renal failure on admission - responded to kayexalate enema and insulin/D50 - now planned for CRRT

## 2022-07-17 NOTE — Assessment & Plan Note (Addendum)
-   Initially 4.5 on admission.  Has peaked at 7.5 -Responding some to fluid resuscitation -Continue fluids/bicarb drip and trending

## 2022-07-17 NOTE — Assessment & Plan Note (Addendum)
-   baseline creatinine ~ 1 - patient presents with increase in creat >0.3 mg/dL above baseline, creat increase >1.5x baseline presumed to have occurred within past 7 days PTA - etiology suspected from pancreatitis - at risk for ATN - lactic peaked at 7.5, some slow downtrend - given metabolic acidosis, started bicarb drip - nephrology also consulted given oliguria and seems to be developing progressive renal failure - tentative plan is for CRRT to be initiated given worsening renal function and ensuing anuria

## 2022-07-17 NOTE — Assessment & Plan Note (Addendum)
-   Severely low - Repleting as able

## 2022-07-17 NOTE — Assessment & Plan Note (Signed)
-   Suspected multifactorial in setting of uremic encephalopathy, hepatic encephalopathy

## 2022-07-17 NOTE — Progress Notes (Signed)
[facility] paged about patient bladder scan of 0, CNA and RN attempt to obtain. Order for I&O cath.

## 2022-07-17 NOTE — Assessment & Plan Note (Addendum)
-   etiology considered difficult EGD on 6/18. Patient developed symptoms soon after but would not present until 2022-08-15 after encouragement by family - Lipase 1,765 - Lactic 4.5 with peak at 7.5 - TG 138 - CT shows "Extensive pancreatic edema and peripancreatic fat stranding, consistent with acute pancreatitis. No organized peripancreatic fluid collection. Mild wall thickening and surrounding fat stranding along the hepatic and splenic flexures, likely reactive in the setting of acute pancreatitis." - multi-organ involvement now evolving; prognosis starting to become guarded; I have discussed very openly and bluntly with his wife [name] and son Fredrik Cove

## 2022-07-17 NOTE — Assessment & Plan Note (Addendum)
-   initial trops 54 and 56; now have trended up to 305 >> 322 - some Q waves on EKG and obvious Qtc prolongation in setting of severe electrolyte derangements but for now no CP and BP has been stable - this appears to be demand ischemia from severe pancreatitis; he seemingly has no CP, just the abdominal pains; also not a cath candidate regardless at this time given acuity and overt renal failure - continue trending trops to peak - hold off on heparin given severe pancreatitis  - case discussed with cardiology as well who will follow too  - echo ordered

## 2022-07-17 NOTE — Progress Notes (Signed)
Progress Note    Richard Whitaker   WUJ:[id number]  DOB: 1945-09-30  DOA: 07/16/2022     1 PCP: [facility]  Initial CC: abdominal pain  Hospital Course: Richard Whitaker is a 77 yo male with PMH DMII, HTN, prostate cancer, arthritis who presented with abdominal pain.  He had an EGD on 6/18 at the [facility] for removal of duodenal polyps; incomplete resection was noted due to technically difficult and complex study.  Plan was for reattempt with side-viewing duodenoscope by advanced GI.  Upon returning home from procedure, he began developing nausea/vomiting and worsening abdominal pain.  His wife was able to convince him to present to the ER for evaluation finally later on 07/16/2022.  CT abdomen/pelvis showed extensive pancreatic edema and peripancreatic fat stranding consistent with acute pancreatitis.  No organized fluid collections were noted.  There was also mild wall thickening and surrounding fat stranding along the hepatic and splenic flexures considered reactive from acute pancreatitis. Lipase elevated at 1, 765. Lactic acid also elevated, 4.5 initially. Initial creatinine was 1.7 with no known renal dysfunction at baseline. He was started on fluids and admitted for further monitoring.  Interval History:  Patient overtly confused this morning and has remained confused throughout the day although on repeat checkup this afternoon, his wife states his mentation is slightly better.  He is still rather tender in his abdomen and still having frequent episodes of diarrhea.  Bluntly discussed his worsening status with his wife bedside and their son, Richard Whitaker on the phone. Multiple specialists have been consulted throughout the day as patient's status has been slowly worsening.  Assessment and Plan: * Acute pancreatitis - etiology considered difficult EGD on 6/18. Patient developed symptoms soon after but would not present until 6/19 after encouragement by family - Lipase 1,765 - Lactic 4.5  with peak at 7.5 - TG 138 - CT shows "Extensive pancreatic edema and peripancreatic fat stranding, consistent with acute pancreatitis. No organized peripancreatic fluid collection. Mild wall thickening and surrounding fat stranding along the hepatic and splenic flexures, likely reactive in the setting of acute pancreatitis." - multi-organ involvement now evolving; prognosis starting to become guarded; I have discussed very openly and bluntly with his wife Richard Dandy and son Richard Whitaker (latter on phone)  AKI (acute kidney injury) (HCC) - baseline creatinine ~ 1 - patient presents with increase in creat >0.3 mg/dL above baseline, creat increase >1.5x baseline presumed to have occurred within past 7 days PTA - etiology suspected from pancreatitis - at risk for ATN - lactic peaked at 7.5, some slow downtrend - given metabolic acidosis, will start bicarb drip - nephrology also consulted given oliguria and seems to be developing progressive renal failure  Hyperkalemia - Remains elevated due to worsening renal failure -Unsure if he can handle taking orals -Rectal tube ordered and Kayexalate enema ordered -Definitive treatment would be dialysis.  Nephrology also following -Shifting potassium with insulin/dextrose as well  Elevated troponin - initial trops 54 and 56; now have trended up to 305 >> 322 - some Q waves on EKG and obvious Qtc prolongation in setting of severe electrolyte derangements but for now no CP and BP has been stable - trend trops to peak - hold off on heparin given severe pancreatitis  - case discussed with cardiology as well who will follow too  - echo ordered  Acute metabolic encephalopathy - Suspected multifactorial in setting of uremic encephalopathy, hepatic encephalopathy  Elevated liver enzymes - suspect also consequence of pancreatitis - AST/ALT  starting to climb; at risk for shock liver  - continue trending LFTs - GI also on board  Lactic acidosis - Initially 4.5 on  admission.  Has peaked at 7.5 -Responding some to fluid resuscitation -Continue fluids/bicarb drip and trending  Hyperammonemia (HCC) - In setting of liver dysfunction -Placing rectal tube and initiating lactulose enema  Hypocalcemia - Severely low - Replating as able   Old records reviewed in assessment of this patient  Antimicrobials:   DVT prophylaxis:  heparin injection 5,000 Units Start: 07/16/22 1400 SCDs Start: 07/16/22 1025   Code Status:   Code Status: Full Code  Mobility Assessment (last 72 hours)     Mobility Assessment     Row Name 07/16/22 2000 07/16/22 1657         Does patient have an order for bedrest or is patient medically unstable No - Continue assessment No - Continue assessment      What is the highest level of mobility based on the progressive mobility assessment? Level 3 (Stands with assist) - Balance while standing  and cannot march in place Level 4 (Walks with assist in room) - Balance while marching in place and cannot step forward and back - Complete      Is the above level different from baseline mobility prior to current illness? Yes - Recommend PT order --               Barriers to discharge:  Disposition Plan:  Home Status is: Inpt  Objective: Blood pressure 104/78, pulse (!) 116, temperature 97.8 F (36.6 C), temperature source Axillary, resp. rate 10, height 6\' 1"  (1.854 m), weight 90.7 kg, SpO2 98 %.  Examination:  Physical Exam Constitutional:      Comments: Grossly confused appearing elderly gentleman lying in bed uncomfortable from abdominal pain but no acute distress  HENT:     Head: Normocephalic and atraumatic.     Mouth/Throat:     Mouth: Mucous membranes are dry.  Eyes:     Extraocular Movements: Extraocular movements intact.  Cardiovascular:     Rate and Rhythm: Normal rate and regular rhythm.  Pulmonary:     Effort: Pulmonary effort is normal.     Breath sounds: Normal breath sounds.  Abdominal:     General:  Bowel sounds are decreased. There is distension.     Tenderness: There is generalized abdominal tenderness.  Musculoskeletal:        General: Normal range of motion.     Cervical back: Normal range of motion and neck supple.  Skin:    General: Skin is warm and dry.  Neurological:     Mental Status: He is disoriented.      Consultants:  GI Cardiology Nephrology  Procedures:    Data Reviewed: Results for orders placed or performed during the hospital encounter of 07/16/22 (from the past 24 hour(s))  Glucose, capillary     Status: Abnormal   Collection Time: 07/16/22  4:30 PM  Result Value Ref Range   Glucose-Capillary 198 (H) 70 - 99 mg/dL  Glucose, capillary     Status: Abnormal   Collection Time: 07/16/22  5:44 PM  Result Value Ref Range   Glucose-Capillary 197 (H) 70 - 99 mg/dL  Lactic acid, plasma     Status: Abnormal   Collection Time: 07/16/22  6:50 PM  Result Value Ref Range   Lactic Acid, Venous 7.5 (HH) 0.5 - 1.9 mmol/L  Glucose, capillary     Status: Abnormal   Collection  Time: 07/16/22  9:30 PM  Result Value Ref Range   Glucose-Capillary 183 (H) 70 - 99 mg/dL   Comment 1 Notify RN    Comment 2 Document in Chart   Lactic acid, plasma     Status: Abnormal   Collection Time: 07/16/22  9:34 PM  Result Value Ref Range   Lactic Acid, Venous 3.8 (HH) 0.5 - 1.9 mmol/L  CBC     Status: Abnormal   Collection Time: 07/17/22  3:02 AM  Result Value Ref Range   WBC 20.9 (H) 4.0 - 10.5 K/uL   RBC 6.17 (H) 4.22 - 5.81 MIL/uL   Hemoglobin 15.9 13.0 - 17.0 g/dL   HCT 16.1 (H) 09.6 - 04.5 %   MCV 85.3 80.0 - 100.0 fL   MCH 25.8 (L) 26.0 - 34.0 pg   MCHC 30.2 30.0 - 36.0 g/dL   RDW 40.9 (H) 81.1 - 91.4 %   Platelets 201 150 - 400 K/uL   nRBC 0.0 0.0 - 0.2 %  Comprehensive metabolic panel     Status: Abnormal   Collection Time: 07/17/22  3:02 AM  Result Value Ref Range   Sodium 140 135 - 145 mmol/L   Potassium 6.5 (HH) 3.5 - 5.1 mmol/L   Chloride 115 (H) 98 - 111  mmol/L   CO2 13 (L) 22 - 32 mmol/L   Glucose, Bld 186 (H) 70 - 99 mg/dL   BUN 34 (H) 8 - 23 mg/dL   Creatinine, Ser 7.82 (H) 0.61 - 1.24 mg/dL   Calcium 4.8 (LL) 8.9 - 10.3 mg/dL   Total Protein 6.1 (L) 6.5 - 8.1 g/dL   Albumin 3.2 (L) 3.5 - 5.0 g/dL   AST 956 (H) 15 - 41 U/L   ALT 63 (H) 0 - 44 U/L   Alkaline Phosphatase 55 38 - 126 U/L   Total Bilirubin 0.8 0.3 - 1.2 mg/dL   GFR, Estimated 21 (L) >60 mL/min   Anion gap 12 5 - 15  Glucose, capillary     Status: Abnormal   Collection Time: 07/17/22  7:53 AM  Result Value Ref Range   Glucose-Capillary 159 (H) 70 - 99 mg/dL   Comment 1 Notify RN    Comment 2 Document in Chart   Comprehensive metabolic panel     Status: Abnormal   Collection Time: 07/17/22  7:54 AM  Result Value Ref Range   Sodium 141 135 - 145 mmol/L   Potassium 5.8 (H) 3.5 - 5.1 mmol/L   Chloride 115 (H) 98 - 111 mmol/L   CO2 15 (L) 22 - 32 mmol/L   Glucose, Bld 184 (H) 70 - 99 mg/dL   BUN 38 (H) 8 - 23 mg/dL   Creatinine, Ser 2.13 (H) 0.61 - 1.24 mg/dL   Calcium 4.5 (LL) 8.9 - 10.3 mg/dL   Total Protein 6.4 (L) 6.5 - 8.1 g/dL   Albumin 3.2 (L) 3.5 - 5.0 g/dL   AST 086 (H) 15 - 41 U/L   ALT 134 (H) 0 - 44 U/L   Alkaline Phosphatase 57 38 - 126 U/L   Total Bilirubin 0.7 0.3 - 1.2 mg/dL   GFR, Estimated 18 (L) >60 mL/min   Anion gap 11 5 - 15  Lactic acid, plasma     Status: Abnormal   Collection Time: 07/17/22  7:54 AM  Result Value Ref Range   Lactic Acid, Venous 5.0 (HH) 0.5 - 1.9 mmol/L  Lipid panel     Status: Abnormal  Collection Time: 07/17/22 10:12 AM  Result Value Ref Range   Cholesterol 106 0 - 200 mg/dL   Triglycerides 604 <540 mg/dL   HDL 36 (L) >98 mg/dL   Total CHOL/HDL Ratio 2.9 RATIO   VLDL 28 0 - 40 mg/dL   LDL Cholesterol 42 0 - 99 mg/dL  Lipase, blood     Status: Abnormal   Collection Time: 07/17/22 10:12 AM  Result Value Ref Range   Lipase 982 (H) 11 - 51 U/L  Magnesium     Status: Abnormal   Collection Time: 07/17/22 10:12  AM  Result Value Ref Range   Magnesium 1.2 (L) 1.7 - 2.4 mg/dL  Troponin I (High Sensitivity)     Status: Abnormal   Collection Time: 07/17/22 10:12 AM  Result Value Ref Range   Troponin I (High Sensitivity) 305 (HH) <18 ng/L  Glucose, capillary     Status: Abnormal   Collection Time: 07/17/22 11:52 AM  Result Value Ref Range   Glucose-Capillary 132 (H) 70 - 99 mg/dL   Comment 1 Notify RN    Comment 2 Document in Chart   Comprehensive metabolic panel     Status: Abnormal   Collection Time: 07/17/22  1:01 PM  Result Value Ref Range   Sodium 143 135 - 145 mmol/L   Potassium 6.2 (H) 3.5 - 5.1 mmol/L   Chloride 115 (H) 98 - 111 mmol/L   CO2 17 (L) 22 - 32 mmol/L   Glucose, Bld 171 (H) 70 - 99 mg/dL   BUN 42 (H) 8 - 23 mg/dL   Creatinine, Ser 1.19 (H) 0.61 - 1.24 mg/dL   Calcium 4.4 (LL) 8.9 - 10.3 mg/dL   Total Protein 5.7 (L) 6.5 - 8.1 g/dL   Albumin 2.8 (L) 3.5 - 5.0 g/dL   AST 147 (H) 15 - 41 U/L   ALT 223 (H) 0 - 44 U/L   Alkaline Phosphatase 52 38 - 126 U/L   Total Bilirubin 0.7 0.3 - 1.2 mg/dL   GFR, Estimated 15 (L) >60 mL/min   Anion gap 11 5 - 15  Ammonia     Status: Abnormal   Collection Time: 07/17/22  1:01 PM  Result Value Ref Range   Ammonia 40 (H) 9 - 35 umol/L  Lactic acid, plasma     Status: Abnormal   Collection Time: 07/17/22  1:01 PM  Result Value Ref Range   Lactic Acid, Venous 4.4 (HH) 0.5 - 1.9 mmol/L  Troponin I (High Sensitivity)     Status: Abnormal   Collection Time: 07/17/22  1:01 PM  Result Value Ref Range   Troponin I (High Sensitivity) 322 (HH) <18 ng/L    I have reviewed pertinent nursing notes, vitals, labs, and images as necessary. I have ordered labwork to follow up on as indicated.  I have reviewed the last notes from staff over past 24 hours. I have discussed patient's care plan and test results with nursing staff, CM/SW, and other staff as appropriate.  Time spent: Greater than 50% of the 55 minute visit was spent in  counseling/coordination of care for the patient as laid out in the A&P.   LOS: 1 day   Lewie Chamber, MD [facility] 07/17/2022, 2:41 PM

## 2022-07-17 NOTE — Assessment & Plan Note (Signed)
-   suspect also consequence of pancreatitis - AST/ALT starting to climb; at risk for shock liver  - continue trending LFTs - GI also on board

## 2022-07-17 NOTE — Hospital Course (Signed)
Mr. Richard Whitaker is a 77 yo male with PMH DMII, HTN, prostate cancer, arthritis who presented with abdominal pain.  He had an EGD on 6/18 at the [facility] for removal of duodenal polyps; incomplete resection was noted due to technically difficult and complex study.  Plan was for reattempt with side-viewing duodenoscope by advanced GI.  Upon returning home from procedure, he began developing nausea/vomiting and worsening abdominal pain.  His wife was able to convince him to present to the ER for evaluation finally later on 07/16/2022.  CT abdomen/pelvis showed extensive pancreatic edema and peripancreatic fat stranding consistent with acute pancreatitis.  No organized fluid collections were noted.  There was also mild wall thickening and surrounding fat stranding along the hepatic and splenic flexures considered reactive from acute pancreatitis. Lipase elevated at 1, 765. Lactic acid also elevated, 4.5 initially. Initial creatinine was 1.7 with no known renal dysfunction at baseline. He was started on fluids and admitted for further monitoring.

## 2022-07-17 NOTE — Progress Notes (Signed)
[facility] paged about 3cc of urine returned from I&O cath

## 2022-07-17 NOTE — Consult Note (Signed)
Nephrology Consult   Requesting provider: Lewie Chamber, MD Service requesting consult: Hospitalist Reason for consult: Severe oliguric aki   Assessment/Recommendations: [name] is a/an 77 y.o. male with a past medical history HTN, DM2, and GERD who present w/ severe acute pancreatitis c/b severe oliguric aki   Severe oliguric AKI: Likely secondary to ATN associated with severe acute pancreatitis.  Urinalysis only with mild proteinuria -Mgmt of hyperkalemia and acidosis as below -If initial medical efforts do not improve electrolyte abnormalities he may need to start CRRT here. Then transfer to [facility] once more stable for HD -Continue to monitor daily Cr, Dose meds for GFR -Monitor Daily I/Os, Daily weight  -Maintain MAP>65 for optimal renal perfusion.  -Avoid nephrotoxic medications including NSAIDs -Use synthetic opioids (Fentanyl/Dilaudid) if needed -Currently no indication for HD but monitor hyperkalemia closely  Hyperkalemia: Associated with acidosis and AKI.  Not able to tolerate p.o. for [facility].  Receiving Kayexalate enema.  Management of acidosis as below  NAGMA/AGMA: Lactic acidosis also with nonanion gap metabolic acidosis likely related to AKI.  Continue with IV sodium bicarbonate as ordered.  May require dialysis as above  Severe acute pancreatitis: Associated with recent EGD.  Continue with supportive care with IV hydration.  Low threshold to use pressors if needed as well as antibiotics if deemed necessary  Hypocalcemia: Associated with pancreatitis.  Continue with supplementation as needed.  Elevated troponin: Likely related to demand.  Management per primary team.  Cardiology following  Acute metabolic encephalopathy: Likely related to multiple issues including elevated ammonia and acute illness.  BUN minimally elevated so unlikely a main factor but possible he has some degree of uremia  Lactic acidosis: Continue with fluids.  Consider pressors if  needed  Recommendations conveyed to primary service.    Darnell Level [facility] 07/17/2022 3:47 PM   _____________________________________________________________________________________ CC: AKI  History of Present Illness: Richard Whitaker is a/an 77 y.o. male with a past medical history of DM2, HTN, GERD who presents with confusion and lethargy.  Given the patient's AMS history was obtained per chart review and from his wife.  Patient presented to the hospital yesterday after experiencing some abnormal symptoms at home.  The patient underwent an upper endoscopy the day before arrival.  Shortly after the procedure he started to have severe abdominal pain.  The pain did not radiate.  Described the pain as sharp but also aching.  Also experiencing nausea and vomiting.  Had some dizziness and lightheadedness and eventually had an episode of possible syncope which led to his wife calling EMS. She describes the episode as him lowering his head and his left leg jerking for a short period of time. His confusion with the episode improved quickly.  Did not have any fevers, chills, cough, shortness of breath, chest pain. His mental state started worsening at home but is much worse at this point. Before his EGD he was in good health with no concerning symptoms  In the emergency department the patient's labs demonstrated a creatinine of 1.7.  Baseline is presumptively normal with last normal in 2019.  Lipase was elevated at 1765 also with some elevated LFTs, lactic acid 5.4, mild leukocytosis, troponin elevation.  CT scan demonstrated significant pancreatic edema concerning for pancreatitis. No signs of hydronephrosis or obstruction.  Since arrival the patient's urine output has been fairly low.  Creatinine is in crease significantly up to 3.9.  Some acidosis and was started on sodium bicarb and infusion this morning  with improvement of bicarb up to 17.  Lactic acidosis persists with  lactic acid of 4.4 most recently.  Also with some hyperkalemia with potassium of 6.2 today.  LFTs continue to rise with AST of 407 today.  Leukocytosis also more significant up to 20.9.   Medications:  Current Facility-Administered Medications  Medication Dose Route Frequency Provider Last Rate Last Admin   acetaminophen (TYLENOL) tablet 650 mg  650 mg Oral Q6H PRN Kirby Crigler, Mir M, MD       Or   acetaminophen (TYLENOL) suppository 650 mg  650 mg Rectal Q6H PRN Kirby Crigler, Mir M, MD       albuterol (PROVENTIL) (2.5 MG/3ML) 0.083% nebulizer solution 2.5 mg  2.5 mg Nebulization Q2H PRN Kirby Crigler, Mir M, MD       Chlorhexidine Gluconate Cloth 2 % PADS 6 each  6 each Topical Daily Kirby Crigler, Mir M, MD   6 each at 07/17/22 0930   docusate sodium (COLACE) capsule 100 mg  100 mg Oral BID Kirby Crigler, Mir M, MD   100 mg at 07/16/22 2106   heparin injection 5,000 Units  5,000 Units Subcutaneous Q8H Kirby Crigler, Mir M, MD   5,000 Units at 07/17/22 1522   HYDROmorphone (DILAUDID) injection 0.5-1 mg  0.5-1 mg Intravenous Q2H PRN Kirby Crigler, Mir M, MD   1 mg at 07/17/22 0439   insulin aspart (novoLOG) injection 0-6 Units  0-6 Units Subcutaneous Q4H Lewie Chamber, MD       lactulose [facility]) enema 200 gm  300 mL Rectal Once Lewie Chamber, MD       metoprolol tartrate (LOPRESSOR) injection 5 mg  5 mg Intravenous Q6H PRN Kirby Crigler, Mir M, MD       ondansetron [facility]) tablet 4 mg  4 mg Oral Q6H PRN Kirby Crigler, Mir M, MD       Or   ondansetron [facility] injection 4 mg  4 mg Intravenous Q6H PRN Kirby Crigler, Mir M, MD       oxyCODONE (Oxy IR/ROXICODONE) immediate release tablet 5 mg  5 mg Oral Q4H PRN Kirby Crigler, Mir M, MD   5 mg at 07/17/22 0243   pantoprazole (PROTONIX) injection 40 mg  40 mg Intravenous Q24H Kirby Crigler, Mir M, MD   40 mg at 07/17/22 1110   polyethylene glycol (MIRALAX / GLYCOLAX) packet 17 g  17 g Oral Daily PRN Kirby Crigler, Mir M, MD       sodium bicarbonate 150 mEq in sterile water  1,150 mL infusion   Intravenous Continuous Lewie Chamber, MD 125 mL/hr at 07/17/22 1448 Infusion Verify at 07/17/22 1448   sodium polystyrene (KAYEXALATE) 15 GM/60ML suspension 60 g  60 g Rectal Once Lewie Chamber, MD         ALLERGIES Penicillins  MEDICAL HISTORY Past Medical History:  Diagnosis Date   Arthritis    DM (diabetes mellitus) (HCC)    Hypertension    Prostate cancer (HCC)    Reflux      SOCIAL HISTORY Social History   Socioeconomic History   Marital status: Married    Spouse name: Not on file   Number of children: Not on file   Years of education: Not on file   Highest education level: Not on file  Occupational History   Not on file  Tobacco Use   Smoking status: Former    Types: Cigarettes    Quit date: 08/23/2003    Years since quitting: 18.9   Smokeless tobacco: Never  Substance and Sexual Activity   Alcohol use: Yes  Comment: occ   Drug use: No   Sexual activity: Not on file  Other Topics Concern   Not on file  Social History Narrative   Not on file   Social Determinants of Health   Financial Resource Strain: Not on file  Food Insecurity: No Food Insecurity (07/16/2022)   Hunger Vital Sign    Worried About Running Out of Food in the Last Year: Never true    Ran Out of Food in the Last Year: Never true  Transportation Needs: No Transportation Needs (07/16/2022)   PRAPARE - Administrator, Civil Service (Medical): No    Lack of Transportation (Non-Medical): No  Physical Activity: Not on file  Stress: Not on file  Social Connections: Not on file  Intimate Partner Violence: Not At Risk (07/16/2022)   Humiliation, Afraid, Rape, and Kick questionnaire    Fear of Current or Ex-Partner: No    Emotionally Abused: No    Physically Abused: No    Sexually Abused: No     FAMILY HISTORY History reviewed. No pertinent family history.    Review of Systems: 12 systems reviewed Otherwise as per HPI, all other systems reviewed and  negative  Physical Exam: Vitals:   07/17/22 0800 07/17/22 1150  BP: 104/78   Pulse: (!) 116   Resp: 10   Temp:  97.8 F (36.6 C)  SpO2: 98%    Total I/O In: 2026.9 [I.V.:825.9; IV Piggyback:1201.1] Out: -   Intake/Output Summary (Last 24 hours) at 07/17/2022 1547 Last data filed at 07/17/2022 1448 Gross per 24 hour  Intake 5444.78 ml  Output 3 ml  Net 5441.78 ml   General: ill-appearing, waxing and waning between agitation and lethargy HEENT: anicteric sclera, oropharynx clear without lesions CV: tachycardic, no murmurs, no peripheral edema Lungs: clear to auscultation bilaterally, normal work of breathing Abd: soft, mild tenderness to palpation, non-distended Skin: no visible lesions or rashes Psych: asleep but awakens at times and becomes agitated, disoriented Musculoskeletal: no obvious deformities Neuro: normal speech when he is awake but confusion and difficult to redirect, no other obvious deficits  Test Results Reviewed Lab Results  Component Value Date   NA 143 07/17/2022   K 6.2 (H) 07/17/2022   CL 115 (H) 07/17/2022   CO2 17 (L) 07/17/2022   BUN 42 (H) 07/17/2022   CREATININE 3.91 (H) 07/17/2022   CALCIUM 4.4 (LL) 07/17/2022   ALBUMIN 2.8 (L) 07/17/2022    CBC Recent Labs  Lab 07/16/22 0624 07/17/22 0302  WBC 13.0* 20.9*  NEUTROABS 11.0*  --   HGB 16.6 15.9  HCT 52.8* 52.6*  MCV 81.5 85.3  PLT 273 201    I have reviewed all relevant outside healthcare records related to the patient's current hospitalization

## 2022-07-17 NOTE — Progress Notes (Signed)
Patient bladder scanned this AM, 35cc detected on scan, [facility] made aware.

## 2022-07-18 ENCOUNTER — Inpatient Hospital Stay (HOSPITAL_COMMUNITY): Payer: No Typology Code available for payment source

## 2022-07-18 ENCOUNTER — Other Ambulatory Visit (HOSPITAL_COMMUNITY): Payer: Medicare Other

## 2022-07-18 DIAGNOSIS — G9341 Metabolic encephalopathy: Secondary | ICD-10-CM | POA: Diagnosis not present

## 2022-07-18 DIAGNOSIS — N179 Acute kidney failure, unspecified: Secondary | ICD-10-CM

## 2022-07-18 DIAGNOSIS — K851 Biliary acute pancreatitis without necrosis or infection: Secondary | ICD-10-CM | POA: Diagnosis not present

## 2022-07-18 DIAGNOSIS — I10 Essential (primary) hypertension: Secondary | ICD-10-CM | POA: Diagnosis not present

## 2022-07-18 DIAGNOSIS — E875 Hyperkalemia: Secondary | ICD-10-CM | POA: Diagnosis not present

## 2022-07-18 DIAGNOSIS — R7989 Other specified abnormal findings of blood chemistry: Secondary | ICD-10-CM | POA: Diagnosis not present

## 2022-07-18 DIAGNOSIS — K859 Acute pancreatitis without necrosis or infection, unspecified: Secondary | ICD-10-CM | POA: Diagnosis not present

## 2022-07-18 LAB — RENAL FUNCTION PANEL
Albumin: 2.5 g/dL — ABNORMAL LOW (ref 3.5–5.0)
Albumin: 2.2 g/dL — ABNORMAL LOW (ref 3.5–5.0)
Anion gap: 15 (ref 5–15)
Anion gap: 15 (ref 5–15)
BUN: 52 mg/dL — ABNORMAL HIGH (ref 8–23)
BUN: 62 mg/dL — ABNORMAL HIGH (ref 8–23)
CO2: 16 mmol/L — ABNORMAL LOW (ref 22–32)
CO2: 22 mmol/L (ref 22–32)
Calcium: 4.9 mg/dL — CL (ref 8.9–10.3)
Calcium: 4.8 mg/dL — CL (ref 8.9–10.3)
Chloride: 110 mmol/L (ref 98–111)
Chloride: 104 mmol/L (ref 98–111)
Creatinine, Ser: 4.84 mg/dL — ABNORMAL HIGH (ref 0.61–1.24)
Creatinine, Ser: 5.43 mg/dL — ABNORMAL HIGH (ref 0.61–1.24)
GFR, Estimated: 12 mL/min — ABNORMAL LOW (ref >60)
GFR, Estimated: 10 mL/min — ABNORMAL LOW (ref >60)
Glucose, Bld: 146 mg/dL — ABNORMAL HIGH (ref 70–99)
Glucose, Bld: 146 mg/dL — ABNORMAL HIGH (ref 70–99)
Phosphorus: 3.3 mg/dL (ref 2.5–4.6)
Phosphorus: 4.1 mg/dL (ref 2.5–4.6)
Potassium: 4.3 mmol/L (ref 3.5–5.1)
Potassium: 3.6 mmol/L (ref 3.5–5.1)
Sodium: 141 mmol/L (ref 135–145)
Sodium: 141 mmol/L (ref 135–145)

## 2022-07-18 LAB — CBC WITH DIFFERENTIAL/PLATELET
Abs Immature Granulocytes: 0.03 10*3/uL (ref 0.00–0.07)
Basophils Absolute: 0 10*3/uL (ref 0.0–0.1)
Basophils Relative: 0 %
Eosinophils Absolute: 0.1 10*3/uL (ref 0.0–0.5)
Eosinophils Relative: 1 %
HCT: 39.2 % (ref 39.0–52.0)
Hemoglobin: 12.5 g/dL — ABNORMAL LOW (ref 13.0–17.0)
Immature Granulocytes: 0 %
Lymphocytes Relative: 10 %
Lymphs Abs: 1.2 10*3/uL (ref 0.7–4.0)
MCH: 26 pg (ref 26.0–34.0)
MCHC: 31.9 g/dL (ref 30.0–36.0)
MCV: 81.7 fL (ref 80.0–100.0)
Monocytes Absolute: 1.1 10*3/uL — ABNORMAL HIGH (ref 0.1–1.0)
Monocytes Relative: 9 %
Neutro Abs: 9.8 10*3/uL — ABNORMAL HIGH (ref 1.7–7.7)
Neutrophils Relative %: 80 %
Platelets: 167 10*3/uL (ref 150–400)
RBC: 4.8 MIL/uL (ref 4.22–5.81)
RDW: 15.5 % (ref 11.5–15.5)
WBC: 12.3 10*3/uL — ABNORMAL HIGH (ref 4.0–10.5)
nRBC: 0 % (ref 0.0–0.2)

## 2022-07-18 LAB — COMPREHENSIVE METABOLIC PANEL
ALT: 220 U/L — ABNORMAL HIGH (ref 0–44)
AST: 460 U/L — ABNORMAL HIGH (ref 15–41)
Albumin: 2.5 g/dL — ABNORMAL LOW (ref 3.5–5.0)
Alkaline Phosphatase: 51 U/L (ref 38–126)
Anion gap: 13 (ref 5–15)
BUN: 57 mg/dL — ABNORMAL HIGH (ref 8–23)
CO2: 19 mmol/L — ABNORMAL LOW (ref 22–32)
Calcium: 4.8 mg/dL — CL (ref 8.9–10.3)
Chloride: 108 mmol/L (ref 98–111)
Creatinine, Ser: 5.14 mg/dL — ABNORMAL HIGH (ref 0.61–1.24)
GFR, Estimated: 11 mL/min — ABNORMAL LOW (ref >60)
Glucose, Bld: 137 mg/dL — ABNORMAL HIGH (ref 70–99)
Potassium: 3.9 mmol/L (ref 3.5–5.1)
Sodium: 140 mmol/L (ref 135–145)
Total Bilirubin: 0.6 mg/dL (ref 0.3–1.2)
Total Protein: 5 g/dL — ABNORMAL LOW (ref 6.5–8.1)

## 2022-07-18 LAB — GLUCOSE, CAPILLARY
Glucose-Capillary: 118 mg/dL — ABNORMAL HIGH (ref 70–99)
Glucose-Capillary: 119 mg/dL — ABNORMAL HIGH (ref 70–99)
Glucose-Capillary: 121 mg/dL — ABNORMAL HIGH (ref 70–99)
Glucose-Capillary: 140 mg/dL — ABNORMAL HIGH (ref 70–99)
Glucose-Capillary: 150 mg/dL — ABNORMAL HIGH (ref 70–99)
Glucose-Capillary: 99 mg/dL (ref 70–99)

## 2022-07-18 LAB — HEPATIC FUNCTION PANEL
ALT: 253 U/L — ABNORMAL HIGH (ref 0–44)
AST: 589 U/L — ABNORMAL HIGH (ref 15–41)
Albumin: 2.5 g/dL — ABNORMAL LOW (ref 3.5–5.0)
Alkaline Phosphatase: 48 U/L (ref 38–126)
Bilirubin, Direct: 0.6 mg/dL — ABNORMAL HIGH (ref 0.0–0.2)
Indirect Bilirubin: 0.1 mg/dL — ABNORMAL LOW (ref 0.3–0.9)
Total Bilirubin: 0.7 mg/dL (ref 0.3–1.2)
Total Protein: 5.1 g/dL — ABNORMAL LOW (ref 6.5–8.1)

## 2022-07-18 LAB — TROPONIN I (HIGH SENSITIVITY)
Troponin I (High Sensitivity): 480 ng/L (ref <18)
Troponin I (High Sensitivity): 218 ng/L (ref <18)

## 2022-07-18 LAB — ECHOCARDIOGRAM COMPLETE: Height: 73 in

## 2022-07-18 LAB — LIPASE, BLOOD: Lipase: 393 U/L — ABNORMAL HIGH (ref 11–51)

## 2022-07-18 LAB — LACTIC ACID, PLASMA
Lactic Acid, Venous: 2.9 mmol/L (ref 0.5–1.9)
Lactic Acid, Venous: 2.7 mmol/L (ref 0.5–1.9)

## 2022-07-18 LAB — AMMONIA: Ammonia: 25 umol/L (ref 9–35)

## 2022-07-18 LAB — MAGNESIUM: Magnesium: 1.8 mg/dL (ref 1.7–2.4)

## 2022-07-18 MED ORDER — MIDAZOLAM HCL 2 MG/2ML IJ SOLN
INTRAMUSCULAR | Status: AC
  Administered 2022-07-18: 2 mg via INTRAVENOUS
  Filled 2022-07-18: qty 2

## 2022-07-18 MED ORDER — PERFLUTREN LIPID MICROSPHERE
1.0000 mL | INTRAVENOUS | Status: AC | PRN
  Administered 2022-07-18: 3 mL via INTRAVENOUS

## 2022-07-18 MED ORDER — CALCIUM GLUCONATE-NACL 2-0.675 GM/100ML-% IV SOLN
2.0000 g | INTRAVENOUS | Status: AC
  Administered 2022-07-18 (×2): 2000 mg via INTRAVENOUS
  Filled 2022-07-18 (×2): qty 100

## 2022-07-18 MED ORDER — NALOXONE HCL 0.4 MG/ML IJ SOLN: 0.1000 mg | INTRAMUSCULAR | Status: DC | PRN

## 2022-07-18 MED ORDER — PRISMASOL BGK 4/2.5 32-4-2.5 MEQ/L REPLACEMENT SOLN: Status: DC

## 2022-07-18 MED ORDER — OXYCODONE HCL 5 MG PO TABS
5.0000 mg | ORAL_TABLET | ORAL | Status: DC | PRN
  Administered 2022-07-20: 5 mg
  Filled 2022-07-18 (×2): qty 1

## 2022-07-18 MED ORDER — MIDAZOLAM HCL 2 MG/2ML IJ SOLN: 2.0000 mg | Freq: Once | INTRAMUSCULAR | Status: AC

## 2022-07-18 MED ORDER — HYDROMORPHONE HCL 1 MG/ML IJ SOLN
1.0000 mg | Freq: Once | INTRAMUSCULAR | Status: AC
  Administered 2022-07-18: 1 mg via INTRAVENOUS

## 2022-07-18 MED ORDER — SODIUM CHLORIDE 0.9 % IV SOLN: 4.0000 g | Freq: Once | INTRAVENOUS | Status: DC

## 2022-07-18 MED ORDER — ORAL CARE MOUTH RINSE
15.0000 mL | OROMUCOSAL | Status: DC
  Administered 2022-07-19 – 2022-07-20 (×8): 15 mL via OROMUCOSAL

## 2022-07-18 MED ORDER — PRISMASOL BGK 4/2.5 32-4-2.5 MEQ/L EC SOLN: Status: DC

## 2022-07-18 MED ORDER — VITAL 1.5 CAL PO LIQD
1000.0000 mL | ORAL | Status: DC
  Administered 2022-07-18: 1000 mL
  Filled 2022-07-18 (×2): qty 1000

## 2022-07-18 MED ORDER — ORAL CARE MOUTH RINSE: 15.0000 mL | OROMUCOSAL | Status: DC | PRN

## 2022-07-18 MED ORDER — MAGNESIUM SULFATE 2 GM/50ML IV SOLN
2.0000 g | Freq: Once | INTRAVENOUS | Status: AC
  Administered 2022-07-18: 2 g via INTRAVENOUS
  Filled 2022-07-18: qty 50

## 2022-07-18 MED ORDER — HEPARIN SODIUM (PORCINE) 1000 UNIT/ML DIALYSIS
1000.0000 [IU] | INTRAMUSCULAR | Status: DC | PRN
  Administered 2022-07-18: 3000 [IU] via INTRAVENOUS_CENTRAL
  Administered 2022-07-20: 2000 [IU] via INTRAVENOUS_CENTRAL
  Filled 2022-07-18 (×4): qty 6

## 2022-07-18 NOTE — Consult Note (Signed)
NAME:  Richard Whitaker, MRN:  [id number], DOB:  Nov 08, 1945, LOS: 2 ADMISSION DATE:  07/26/2022, CONSULTATION DATE:  07/18/22 REFERRING MD:  Frederick Peers - [facility] , CHIEF COMPLAINT:  abd pain   History of Present Illness:  [name] PMH DM2 HTN who presented to [facility] ED 6/19 with abd pain and vomiting. On 6/18 pt underwent EGD with duodenal polyp bx and cautery. Workup in ED revealed elevated Lipase and LFTs, as well as CT findings concerning for acute pancreatitis.  Admitted to [facility] and GI consulted. On 6/20 he had worse renal failure, associated hyperkalemia and acidosis. Nephro consulted. 6/21 worse renal fxn, mentation, and minimal UOP -- plan for CRRT initiation   PCCM consulted in this setting   Pertinent  Medical History  Duodenal polyp HTN DM2  Prior tobacco use Hx prostate Ca, hodgkins dz  HLD Anemia  Significant Hospital Events: Including procedures, antibiotic start and stop dates in addition to other pertinent events   6/18 EGD with duodenal polyp biopsy + incomplete resection at [facility]. Started vomiting at home after  6/19 [facility] admit for acute pancreatitis. GI consult  6/20 worse renal fxn + lyte abnormalities and acidosis. Nephro consult   6/21 worse mentation, renal fxn. Lytes ok acidosis improving on bicarb gtt. PCCM consult for CRRT  Interim History / Subjective:  Worse renal fxn and minimal UOP   Objective   Blood pressure 138/79, pulse 97, temperature 100 F (37.8 C), resp. rate 14, height 6\' 1"  (1.854 m), weight 90.7 kg, SpO2 97 %.        Intake/Output Summary (Last 24 hours) at 07/18/2022 1402 Last data filed at 07/18/2022 1358 Gross per 24 hour  Intake 5519.68 ml  Output 550 ml  Net 4969.68 ml   Filed Weights   07/03/2022 0558  Weight: 90.7 kg    Examination: General: wdwn ill appearing M NAD  HENT: NCAT Anicteric sclera Lungs: CTAb deep respirations  Cardiovascular: tachycardic cap refill < 3 sec  Abdomen: round slightly tense  Extremities: no acute joint  deformity  Neuro: Asleep- recent pain medication - pupils 2mm  GU: foley with dark urine   Resolved Hospital Problem list     Assessment & Plan:   Acute metabolic encephalopathy  -medication related + critical illness P -delirium precautions -tx underlying cause   Acute pancreatitis -following EGD duodenal polyp bx incomplete polyp resection at [facility] on 6/18. Thought is possible injury from this.  P -cont IVF and pain mgmnt -- he looks dry, will order add'l bolus  -think before we go for TPN its worth trying enteral in some fashion. Will order for NG tube for trial of PPEN. Will try in the ICU but may have to be IR placement for post-pyloric tube (dont have cortrak magnetic device at [facility])  -follow fever curve -- guidelines favor against empiric ppx abx in severe pancreatitis, would follow closely with slight temp incr and if worsens +/- hemodynamic changes reconsider   AKI NAGMA P -foley strict I/O  -starting CRRT when tri cath placed   Transaminitis, improving P -follow LFTs   Hypocalcemia  P -got 4g CalGlu 6/20 and add'l replacement ongoing now-- I will defer further correction for now, check iCal  which might be a better guide. But think we are going to be chasing this and not sure that outcomes would support aggressive replacement necessarily  -follow qtc and neuro exam   Lactic acidosis, improving -due to above processes P -supportive care tx underlying cause  DM2  -prior to admit, pretty well controlled and not insulin dependent.  P -SSI -Outpt reg may need to be re-eval'd pending clinical course here / potential sequelae of acute pancreatitis   HTN P -PRN IV metop, if acutely hypertensive would consider pain meds   Best Practice (right click and "Reselect all SmartList Selections" daily)   Diet/type: clear liquids is non post pyloric tube, vs trial of PPEN  DVT prophylaxis: prophylactic heparin  GI prophylaxis: PPI Lines: Dialysis Catheter Foley:  Yes,  and it is still needed Code Status:  full code Last date of multidisciplinary goals of care discussion [--]  Labs   CBC: Recent Labs  Lab 07/19/2022 0624 07/17/22 0302 07/18/22 0304  WBC 13.0* 20.9* 12.3*  NEUTROABS 11.0*  --  9.8*  HGB 16.6 15.9 12.5*  HCT 52.8* 52.6* 39.2  MCV 81.5 85.3 81.7  PLT 273 201 167    Basic Metabolic Panel: Recent Labs  Lab 07/17/22 1012 07/17/22 1301 07/17/22 1513 07/17/22 1644 07/17/22 2116 07/18/22 0304 07/18/22 0305 07/18/22 0944  NA  --    < > 142 144 145  --  141 140  K  --    < > 5.1 4.4 4.7  --  4.3 3.9  CL  --    < > 117* 116* 115*  --  110 108  CO2  --    < > 14* 14* 15*  --  16* 19*  GLUCOSE  --    < > 155* 179* 140*  --  146* 137*  BUN  --    < > 46* 47* 48*  --  52* 57*  CREATININE  --    < > 4.16* 4.48* 4.54*  --  4.84* 5.14*  CALCIUM  --    < > 4.7* 4.8* 5.1*  --  4.9* 4.8*  MG 1.2*  --   --   --   --  1.8  --   --   PHOS  --   --   --   --  2.9  --  3.3  --    < > = values in this interval not displayed.   GFR: Estimated Creatinine Clearance: 14.2 mL/min (A) (by C-G formula based on SCr of 5.14 mg/dL (H)). Recent Labs  Lab 07/12/2022 0624 07/04/2022 0650 07/17/22 0302 07/17/22 0754 07/17/22 1644 07/17/22 2116 07/18/22 0150 07/18/22 0304 07/18/22 0453  WBC 13.0*  --  20.9*  --   --   --   --  12.3*  --   LATICACIDVEN  --    < >  --    < > 6.6* 4.7* 2.9*  --  2.7*   < > = values in this interval not displayed.    Liver Function Tests: Recent Labs  Lab 07/17/22 1301 07/17/22 1513 07/17/22 2116 07/18/22 0304 07/18/22 0305 07/18/22 0944  AST 407* 567* 647* 589*  --  460*  ALT 223* 276* 278* 253*  --  220*  ALKPHOS 52 52 50 48  --  51  BILITOT 0.7 0.6 0.6 0.7  --  0.6  PROT 5.7* 5.7* 5.2* 5.1*  --  5.0*  ALBUMIN 2.8* 2.9* 2.6* 2.5* 2.5* 2.5*   Recent Labs  Lab July 21, 2022 0643 07/17/22 1012 07/18/22 0304  LIPASE 1,765* 982* 393*   Recent Labs  Lab 07/17/22 1301 07/18/22 0150  AMMONIA 40* 25     ABG    Component Value Date/Time   HCO3 18.0 (L) 07/17/2022 2306  TCO2 27 08/22/2013 1129   ACIDBASEDEF 9.9 (H) 07/17/2022 2306   O2SAT 37.5 07/17/2022 2306     Coagulation Profile: No results for input(s): "INR", "PROTIME" in the last 168 hours.  Cardiac Enzymes: No results for input(s): "CKTOTAL", "CKMB", "CKMBINDEX", "TROPONINI" in the last 168 hours.  HbA1C: Hgb A1c MFr Bld  Date/Time Value Ref Range Status  07/17/2022 06:29 AM 5.9 (H) 4.8 - 5.6 % Final    Comment:    (NOTE) Pre diabetes:          5.7%-6.4%  Diabetes:              >6.4%  Glycemic control for   <7.0% adults with diabetes   08/22/2013 01:35 PM 11.2 (H) <5.7 % Final    Comment:    (NOTE)                                                                       According to the ADA Clinical Practice Recommendations for 2011, when HbA1c is used as a screening test:  >=6.5%   Diagnostic of Diabetes Mellitus           (if abnormal result is confirmed) 5.7-6.4%   Increased risk of developing Diabetes Mellitus References:Diagnosis and Classification of Diabetes Mellitus,Diabetes Care,2011,34(Suppl 1):S62-S69 and Standards of Medical Care in         Diabetes - 2011,Diabetes Care,2011,34 (Suppl 1):S11-S61.    CBG: Recent Labs  Lab 07/17/22 1152 07/17/22 1554 07/17/22 2018 07/18/22 0051 07/18/22 1205  GLUCAP 132* 157* 138* 119* 150*    Review of Systems:   Unable due to encephalopathy   From chart review + n/v/abdominal pain   Past Medical History:  He,  has a past medical history of Arthritis, DM (diabetes mellitus) (HCC), Hypertension, Prostate cancer (HCC), and Reflux.   Surgical History:   Past Surgical History:  Procedure Laterality Date   hip replacement  Right 12/14/2017   TRANSURETHRAL RESECTION OF PROSTATE       Social History:   reports that he quit smoking about 18 years ago. His smoking use included cigarettes. He has never used smokeless tobacco. He reports current alcohol  use. He reports that he does not use drugs.   Family History:  His family history is not on file.   Allergies Allergies  Allergen Reactions   Penicillins     unknown     Home Medications  Prior to Admission medications   Medication Sig Start Date End Date Taking? Authorizing Provider  aspirin EC 81 MG tablet Take 81 mg by mouth daily. Swallow whole.   Yes [provider]  atorvastatin (LIPITOR) 40 MG tablet Take 40 mg by mouth daily.   Yes [provider]  ferrous sulfate 325 (65 FE) MG EC tablet Take 325 mg by mouth daily with breakfast.   Yes [provider]  glipiZIDE (GLUCOTROL) 10 MG tablet Take 10 mg by mouth daily before breakfast.   Yes [provider]  metFORMIN (GLUCOPHAGE) 1000 MG tablet Take 1,000 mg by mouth 2 (two) times daily with a meal.   Yes [provider]  metoprolol tartrate (LOPRESSOR) 50 MG tablet Take 25 mg by mouth 2 (two) times daily.   Yes [provider]  Multiple Vitamin (MULTIVITAMIN WITH MINERALS) TABS tablet Take 1 tablet by mouth daily.   Yes [provider]  pantoprazole (PROTONIX) 40 MG tablet Take 40 mg by mouth 2 (two) times daily.   Yes [provider]  triamterene-hydrochlorothiazide (MAXZIDE) 75-50 MG per tablet Take 0.5 tablets by mouth daily.   Yes [provider]  metFORMIN (GLUCOPHAGE) 500 MG tablet Take 1 tablet (500 mg total) by mouth 2 (two) times daily with a meal. Patient not taking: Reported on 12/18/2017 08/22/13   Gilda Crease, MD     Critical care time: 48 min        CRITICAL CARE Performed by: Lanier Clam   Total critical care time: 48 minutes  Critical care time was exclusive of separately billable procedures and treating other patients. Critical care was necessary to treat or prevent imminent or life-threatening deterioration.  Critical care was time spent personally by me on the following activities: development of treatment  plan with patient and/or surrogate as well as nursing, discussions with consultants, evaluation of patient's response to treatment, examination of patient, obtaining history from patient or surrogate, ordering and performing treatments and interventions, ordering and review of laboratory studies, ordering and review of radiographic studies, pulse oximetry and re-evaluation of patient's condition.  Tessie Fass MSN, AGACNP-BC [facility] Amion for pager  07/18/2022, 3:53 PM

## 2022-07-18 NOTE — Progress Notes (Signed)
[facility] Progress Note   Assessment/ Plan:   Assessment/Recommendations: Richard Whitaker is a 77 y.o. male with a past medical history HTN, DM2, and GERD who present w/ severe acute pancreatitis c/b severe oliguric aki    Severe oliguric AKI: Likely secondary to ATN associated with severe acute pancreatitis.  Urinalysis only with mild proteinuria - very little UOP and Cr continues to rise - sig electrolyte abnormalities - needs dialytic intervention- discussed with pt, wife, and son at bedside - start CRRT- make sure he can tolerate dialytic intervention- sometimes severe pancreatitis third spaces so much that their pressures drop rapidly on initiation of dialysis   Hyperkalemia:  - CRRT will correct   NAGMA/AGMA:  - CRRT will correct   Severe acute pancreatitis: Associated with recent EGD.    Hypocalcemia: Associated with pancreatitis.   - continue aggressive repletion   Elevated troponin: Likely related to demand.  Management per primary team.  Cardiology following   Acute metabolic encephalopathy: Likely related to multiple issues including elevated ammonia and acute illness.    Subjective:    Seen in room.  Very little urine output.  Cr up to 5.14, Ca 4.8.   Objective:   BP 138/79 (BP Location: Right Arm)   Pulse 97   Temp 100 F (37.8 C)   Resp 14   Ht 6\' 1"  (1.854 m)   Wt 90.7 kg   SpO2 97%   BMI 26.39 kg/m   Intake/Output Summary (Last 24 hours) at 07/18/2022 1247 Last data filed at 07/18/2022 3086 Gross per 24 hour  Intake 4839.6 ml  Output 550 ml  Net 4289.6 ml   Weight change:   Physical Exam: Gen:NAD, ill-appearing CVS: tachycardic Resp: RRR Abd: very tender in epigastric Ext: no LE edema  Imaging: DG CHEST PORT 1 VIEW  Result Date: 07/18/2022 CLINICAL DATA:  Fevers EXAM: PORTABLE CHEST 1 VIEW COMPARISON:  07/18/2022 FINDINGS: Cardiac shadow is stable. Overall inspiratory effort is poor. Left retrocardiac consolidation is  noted with associated small effusion. No bony abnormality is noted. IMPRESSION: Increasing left retrocardiac consolidation with small effusion. Electronically Signed   By: Alcide Clever M.D.   On: 07/18/2022 10:01    Labs: BMET Recent Labs  Lab 07/17/22 0754 07/17/22 1301 07/17/22 1513 07/17/22 1644 07/17/22 2116 07/18/22 0305 07/18/22 0944  NA 141 143 142 144 145 141 140  K 5.8* 6.2* 5.1 4.4 4.7 4.3 3.9  CL 115* 115* 117* 116* 115* 110 108  CO2 15* 17* 14* 14* 15* 16* 19*  GLUCOSE 184* 171* 155* 179* 140* 146* 137*  BUN 38* 42* 46* 47* 48* 52* 57*  CREATININE 3.51* 3.91* 4.16* 4.48* 4.54* 4.84* 5.14*  CALCIUM 4.5* 4.4* 4.7* 4.8* 5.1* 4.9* 4.8*  PHOS  --   --   --   --  2.9 3.3  --    CBC Recent Labs  Lab 07/27/2022 0624 07/17/22 0302 07/18/22 0304  WBC 13.0* 20.9* 12.3*  NEUTROABS 11.0*  --  9.8*  HGB 16.6 15.9 12.5*  HCT 52.8* 52.6* 39.2  MCV 81.5 85.3 81.7  PLT 273 201 167    Medications:     Chlorhexidine Gluconate Cloth  6 each Topical Daily   docusate sodium  100 mg Oral BID   heparin  5,000 Units Subcutaneous Q8H   insulin aspart  0-6 Units Subcutaneous Q4H   pantoprazole (PROTONIX) IV  40 mg Intravenous Q24H   sodium zirconium cyclosilicate  20 g Oral Once  Bufford Buttner MD 07/18/2022, 12:47 PM

## 2022-07-18 NOTE — Progress Notes (Signed)
NGT secured with bridle without incident. Patient tolerated well, no adverse events noted,

## 2022-07-18 NOTE — TOC Initial Note (Addendum)
Transition of Care [facility]) - Initial/Assessment Note    Patient Details  Name: Richard Whitaker MRN: [id number] Date of Birth: Oct 02, 1945  [facility] CM/SW Contact:    Lavenia Atlas, RN Phone Number: 07/18/2022, 6:04 PM  Clinical Narrative:                 Per chart review patient currently in [facility] ICU for acute pancreatitis, acute renal failure. Recent  upper endoscopy. Patient is with [facility]. No current TOC needs.  TOC will follow for needs         Patient Goals and CMS Choice            Expected Discharge Plan and Services                                              Prior Living Arrangements/Services                       Activities of Daily Living Home Assistive Devices/Equipment: Cane (specify quad or straight) (straight cane - uses it "on occasion") ADL Screening (condition at time of admission) Patient's cognitive ability adequate to safely complete daily activities?: Yes Is the patient deaf or have difficulty hearing?: No Does the patient have difficulty seeing, even when wearing glasses/contacts?: No Does the patient have difficulty concentrating, remembering, or making decisions?: No Patient able to express need for assistance with ADLs?: Yes Does the patient have difficulty dressing or bathing?: No Independently performs ADLs?: Yes (appropriate for developmental age) Does the patient have difficulty walking or climbing stairs?: No Weakness of Legs: None Weakness of Arms/Hands: None  Permission Sought/Granted                  Emotional Assessment              Admission diagnosis:  Acute pancreatitis [K85.90] Acute pancreatitis, unspecified complication status, unspecified pancreatitis type [K85.90] Patient Active Problem List   Diagnosis Date Noted   Severe acute pancreatitis 07/18/2022   AKI (acute kidney injury) (HCC) 07/17/2022   Elevated troponin 07/17/2022   Elevated liver enzymes  07/17/2022   Hypocalcemia 07/17/2022   Acute metabolic encephalopathy 07/17/2022   Lactic acidosis 07/17/2022   Acute pancreatitis 07/07/2022   SBO (small bowel obstruction) (HCC) 12/18/2017   PCP:  Clinic, [facility] Pharmacy:   [facility] [phone] - [place], [place] - [address] AT [address] [place] [address] Phone: [phone] Fax: [phone]     Social Determinants of Health (SDOH) Social History: SDOH Screenings   Food Insecurity: No Food Insecurity (06/28/2022)  Housing: Low Risk  (07/23/2022)  Transportation Needs: No Transportation Needs (07/24/2022)  Utilities: Not At Risk (07/09/2022)  Tobacco Use: Medium Risk (07/03/2022)   SDOH Interventions:     Readmission Risk Interventions    07/18/2022    5:51 PM  Readmission Risk Prevention Plan  Transportation Screening Complete  PCP or Specialist Appt within 5-7 Days Complete  Home Care Screening Complete  Medication Review (RN CM) Complete

## 2022-07-18 NOTE — Procedures (Signed)
Central Venous Catheter Insertion Procedure Note  Richard Whitaker  [id number]  12-26-1945  Date:07/18/22  Time:4:56 PM   Provider Performing: R    Procedure: Insertion of Non-tunneled Central Venous Catheter(36556)with US guidance (40981)    Indication(s) Hemodialysis  Consent Risks of the procedure as well as the alternatives and risks of each were explained to the patient and/or caregiver.  Consent for the procedure was obtained and is signed in the bedside chart  Anesthesia Topical only with 1% lidocaine   Timeout Verified patient identification, verified procedure, site/side was marked, verified correct patient position, special equipment/implants available, medications/allergies/relevant history reviewed, required imaging and test results available.  Sterile Technique Maximal sterile technique including full sterile barrier drape, hand hygiene, sterile gown, sterile gloves, mask, hair covering, sterile ultrasound probe cover (if used).  Procedure Description Area of catheter insertion was cleaned with chlorhexidine and draped in sterile fashion.  Initial placement was planned for right internal jugular vein with good landmarks.  Unfortunate with encephalopathy he was unable to participate with exam and cooperate with instructions.  Medications were administered to help make patient more cooperative with procedure.  Unfortunately, he could not tolerate lying in Trendelenburg.  Due to hypoxia.  This quickly corrected with an sitting upright.  Thus, a different site, right femoral vein was chosen.  The the new area of catheter insertion was cleaned with chlorhexidine and draped in sterile fashion.  With real-time ultrasound guidance a HD catheter was placed into the right femoral vein.  The wire was visualized coursing into the vein and away from the artery with real-time ultrasound images.  Nonpulsatile blood flow and easy flushing noted in all ports.  The catheter was  sutured in place and sterile dressing applied.  Complications/Tolerance None; patient tolerated the procedure well. Chest x-ray is not ordered for femoral cannulation.  EBL 10 cc  Specimen(s) None

## 2022-07-18 NOTE — Progress Notes (Addendum)
Initial Nutrition Assessment  DOCUMENTATION CODES:   Not applicable  INTERVENTION:   -RD will follow for diet advancement and add supplements as appropriate -If this aligns within pt's goals of care, consider initial of enteral nutrition support:  Initiate Vital 1.5 @ 20 ml/hr and increase by 10 ml every 4 hours to goal rate of 60 ml/hr.   60 ml Prosource TF BID    Tube feeding regimen provides 2320 kcal (100% of needs), 137 grams of protein, and 1100 ml of H2O.    NUTRITION DIAGNOSIS:   Increased nutrient needs related to acute illness (pancreatitis) as evidenced by estimated needs.  GOAL:   Patient will meet greater than or equal to 90% of their needs  MONITOR:   Diet advancement  REASON FOR ASSESSMENT:   Consult Assessment of nutrition requirement/status  ASSESSMENT:   Pt with medical history significant for non-insulin-dependent type 2 diabetes, hypertension presents to the emergency department with complaints of syncope, abdominal pain, and vomiting since 1 day PTA.  Pt admitted with acute pancreatitis.   Reviewed I/O's: +5.1 L x 24 hours and +8.8 L since admission  Rectal tube output: 550 ml x 24 hours  Pt unavailable at time of visit. Attempted to speak with pt via call to hospital room phone, however, unable to reach. RD unable to obtain further nutrition-related history or complete nutrition-focused physical exam at this time.    Case discussed with PCCM NP; plan to try to place NGT (likely gastric placement) with trial of clear liquids.   Pt with severe oliguric AKI; plan to start CRRT today.   Pt somnolent. Per MD notes, pt declining and prognosis is guarded. Per GI notes, plan to continue NPO and not advance diet. Enteral feedings may need to be considered if this is within pt's goals of care.    No recent wt history available to assess. No significant wt loss over the past several years.   Medications reviewed and include colace, protonix, sodium  bicarbonate, and calcium gluconate.   Lab Results  Component Value Date   HGBA1C 5.9 (H) 07/15/2022   PTA DM medications are 1000 mg metformin BID and 10 mg glipizide daily.   Labs reviewed: CBGS: 150 (inpatient orders for glycemic control are 0-6 units insulin aspart every 4 hours).    Diet Order:   Diet Order             Diet NPO time specified Except for: Sips with Meds, Ice Chips  Diet effective now                   EDUCATION NEEDS:   No education needs have been identified at this time  Skin:  Skin Assessment: Reviewed RN Assessment  Last BM:  07/18/22 (550 ml via rectal tube)  Height:   Ht Readings from Last 1 Encounters:  06/28/2022 6\' 1"  (1.854 m)    Weight:   Wt Readings from Last 1 Encounters:  06/28/2022 90.7 kg    Ideal Body Weight:  83.6 kg  BMI:  Body mass index is 26.39 kg/m.  Estimated Nutritional Needs:   Kcal:  2300-2500  Protein:  125-150 grams  Fluid:  2.0-2.2 L    Levada Schilling, RD, LDN, CDCES Registered Dietitian II Certified Diabetes Care and Education Specialist Please refer to [facility] for RD and/or RD on-call/weekend/after hours pager

## 2022-07-18 NOTE — Plan of Care (Signed)
Plan of care and goals reviewed with family and patient with time given for questions and answers, patient hand book at bedside.  Problem: Education: Goal: Knowledge of General Education information will improve Description: Including pain rating scale, medication(s)/side effects and non-pharmacologic comfort measures Outcome: Progressing   Problem: Health Behavior/Discharge Planning: Goal: Ability to manage health-related needs will improve Outcome: Progressing   Problem: Clinical Measurements: Goal: Ability to maintain clinical measurements within normal limits will improve Outcome: Progressing Goal: Will remain free from infection Outcome: Progressing Goal: Diagnostic test results will improve Outcome: Progressing Goal: Respiratory complications will improve Outcome: Progressing Goal: Cardiovascular complication will be avoided Outcome: Progressing   Problem: Activity: Goal: Risk for activity intolerance will decrease Outcome: Progressing   Problem: Nutrition: Goal: Adequate nutrition will be maintained Outcome: Progressing   Problem: Coping: Goal: Level of anxiety will decrease Outcome: Progressing   Problem: Elimination: Goal: Will not experience complications related to bowel motility Outcome: Progressing Goal: Will not experience complications related to urinary retention Outcome: Progressing   Problem: Pain Managment: Goal: General experience of comfort will improve Outcome: Progressing   Problem: Safety: Goal: Ability to remain free from injury will improve Outcome: Progressing   Problem: Skin Integrity: Goal: Risk for impaired skin integrity will decrease Outcome: Progressing   Problem: Education: Goal: Ability to describe self-care measures that may prevent or decrease complications (Diabetes Survival Skills Education) will improve Outcome: Progressing Goal: Individualized Educational Video(s) Outcome: Progressing   Problem: Coping: Goal: Ability  to adjust to condition or change in health will improve Outcome: Progressing   Problem: Fluid Volume: Goal: Ability to maintain a balanced intake and output will improve Outcome: Progressing   Problem: Health Behavior/Discharge Planning: Goal: Ability to identify and utilize available resources and services will improve Outcome: Progressing Goal: Ability to manage health-related needs will improve Outcome: Progressing   Problem: Metabolic: Goal: Ability to maintain appropriate glucose levels will improve Outcome: Progressing   Problem: Nutritional: Goal: Maintenance of adequate nutrition will improve Outcome: Progressing Goal: Progress toward achieving an optimal weight will improve Outcome: Progressing   Problem: Skin Integrity: Goal: Risk for impaired skin integrity will decrease Outcome: Progressing   Problem: Tissue Perfusion: Goal: Adequacy of tissue perfusion will improve Outcome: Progressing   Problem: Safety: Goal: Non-violent Restraint(s) Outcome: Progressing

## 2022-07-18 NOTE — Progress Notes (Signed)
Subjective: Progressive renal dysfunction. Patient somnolent.  Objective: Vital signs in last 24 hours: Temp:  [96.8 F (36 C)-100.6 F (38.1 C)] 100 F (37.8 C) (06/21 0800) Pulse Rate:  [89-121] 97 (06/21 0800) Resp:  [12-41] 14 (06/21 0800) BP: (99-187)/(60-117) 138/79 (06/21 0800) SpO2:  [85 %-100 %] 97 % (06/21 0800) Weight change:  Last BM Date : 07/18/22  PE: GEN:  Somnolent, not conversant ABD:  Moderate distended NEURO:  Not awake/oriented  Lab Results: CBC    Component Value Date/Time   WBC 12.3 (H) 07/18/2022 0304   RBC 4.80 07/18/2022 0304   HGB 12.5 (L) 07/18/2022 0304   HCT 39.2 07/18/2022 0304   PLT 167 07/18/2022 0304   MCV 81.7 07/18/2022 0304   MCH 26.0 07/18/2022 0304   MCHC 31.9 07/18/2022 0304   RDW 15.5 07/18/2022 0304   LYMPHSABS 1.2 07/18/2022 0304   MONOABS 1.1 (H) 07/18/2022 0304   EOSABS 0.1 07/18/2022 0304   BASOSABS 0.0 07/18/2022 0304  CMP     Component Value Date/Time   NA 140 07/18/2022 0944   K 3.9 07/18/2022 0944   CL 108 07/18/2022 0944   CO2 19 (L) 07/18/2022 0944   GLUCOSE 137 (H) 07/18/2022 0944   BUN 57 (H) 07/18/2022 0944   CREATININE 5.14 (H) 07/18/2022 0944   CALCIUM 4.8 (LL) 07/18/2022 0944   PROT 5.0 (L) 07/18/2022 0944   ALBUMIN 2.5 (L) 07/18/2022 0944   AST 460 (H) 07/18/2022 0944   ALT 220 (H) 07/18/2022 0944   ALKPHOS 51 07/18/2022 0944   BILITOT 0.6 07/18/2022 0944   GFRNONAA 11 (L) 07/18/2022 0944   Assessment:   Acute pancreatitis.  Suspect from ampullary biopsies or cautery during recent endoscopy with duodenal polypectomies. Progressive hypocalcemia and acute renal insufficiency.  Poor prognostic indicators.  Plan:   Nephrology following:  dialysis being considered. Supportive care pancreatitis. Prognosis guarded; I reviewed case with family extensively at bedside today. [facility] will follow.   Freddy Jaksch 07/18/2022, 1:05 PM   Cell [phone] If no answer or after 5 PM call  [phone]

## 2022-07-18 NOTE — Progress Notes (Signed)
  Echocardiogram 2D Echocardiogram has been performed.  Milda Smart 07/18/2022, 1:49 PM

## 2022-07-18 NOTE — Progress Notes (Signed)
Progress Note    PHELAN SCHADT   AVW:[id number]  DOB: [birth year]  DOA: 07/27/2022     2 PCP: [facility]  Initial CC: abdominal pain  Hospital Course: Mr. Richard Whitaker is a 77 yo male with PMH DMII, HTN, prostate cancer, arthritis who presented with abdominal pain.  He had an EGD on 6/18 at the [facility] for removal of duodenal polyps; incomplete resection was noted due to technically difficult and complex study.  Plan was for reattempt with side-viewing duodenoscope by advanced GI.  Upon returning home from procedure, he began developing nausea/vomiting and worsening abdominal pain.  His wife was able to convince him to present to the ER for evaluation finally later on 07/09/2022.  CT abdomen/pelvis showed extensive pancreatic edema and peripancreatic fat stranding consistent with acute pancreatitis.  No organized fluid collections were noted.  There was also mild wall thickening and surrounding fat stranding along the hepatic and splenic flexures considered reactive from acute pancreatitis. Lipase elevated at 1, 765. Lactic acid also elevated, 4.5 initially. Initial creatinine was 1.7 with no known renal dysfunction at baseline. He was started on fluids and admitted for further monitoring.  Interval History:  No events overnight.  Wife present bedside this morning.  Also updated son over the phone. Still having ongoing abdominal pain and remains confused.  He was able to tell me his name this morning but that was all.   Assessment and Plan: * Acute pancreatitis - etiology considered difficult EGD on 6/18. Patient developed symptoms soon after but would not present until 6/19 after encouragement by family - Lipase 1,765 - Lactic 4.5 with peak at 7.5 - TG 138 - CT shows "Extensive pancreatic edema and peripancreatic fat stranding, consistent with acute pancreatitis. No organized peripancreatic fluid collection. Mild wall thickening and surrounding fat stranding along the hepatic and  splenic flexures, likely reactive in the setting of acute pancreatitis." - multi-organ involvement now evolving; prognosis starting to become guarded; I have discussed very openly and bluntly with his wife [name] and son Fredrik Cove   AKI (acute kidney injury) (HCC) - baseline creatinine ~ 1 - patient presents with increase in creat >0.3 mg/dL above baseline, creat increase >1.5x baseline presumed to have occurred within past 7 days PTA - etiology suspected from pancreatitis - at risk for ATN - lactic peaked at 7.5, some slow downtrend - given metabolic acidosis, started bicarb drip - nephrology also consulted given oliguria and seems to be developing progressive renal failure - tentative plan is for CRRT to be initiated given worsening renal function and ensuing anuria   Elevated troponin - initial trops 54 and 56; now have trended up to 305 >> 322 - some Q waves on EKG and obvious Qtc prolongation in setting of severe electrolyte derangements but for now no CP and BP has been stable - this appears to be demand ischemia from severe pancreatitis; he seemingly has no CP, just the abdominal pains; also not a cath candidate regardless at this time given acuity and overt renal failure - continue trending trops to peak - hold off on heparin given severe pancreatitis  - case discussed with cardiology as well who will follow too  - echo ordered  Hyperkalemia-resolved as of 07/18/2022 - elevated due to worsening renal failure on admission - responded to kayexalate enema and insulin/D50 - now planned for CRRT   Acute metabolic encephalopathy - Suspected multifactorial in setting of uremic encephalopathy, hepatic encephalopathy  Elevated liver enzymes - suspect also consequence of  pancreatitis - AST/ALT starting to climb; at risk for shock liver  - continue trending LFTs - GI also on board  Lactic acidosis - Initially 4.5 on admission.  Has peaked at 7.5 -Responding some to fluid  resuscitation -Continue fluids/bicarb drip and trending  Hypocalcemia - Severely low - Repleting as able  Hyperammonemia (HCC)-resolved as of 07/18/2022 - In setting of liver dysfunction - NH3 improved after lactulose enema   Old records reviewed in assessment of this patient  Antimicrobials:   DVT prophylaxis:  heparin injection 5,000 Units Start: 07/06/2022 1400 SCDs Start: 07/18/2022 1025   Code Status:   Code Status: Full Code  Mobility Assessment (last 72 hours)     Mobility Assessment     Row Name 07/17/22 2000 07/24/2022 2000 07/06/2022 1657       Does patient have an order for bedrest or is patient medically unstable No - Continue assessment No - Continue assessment No - Continue assessment     What is the highest level of mobility based on the progressive mobility assessment? Level 3 (Stands with assist) - Balance while standing  and cannot march in place Level 3 (Stands with assist) - Balance while standing  and cannot march in place Level 4 (Walks with assist in room) - Balance while marching in place and cannot step forward and back - Complete     Is the above level different from baseline mobility prior to current illness? Yes - Recommend PT order Yes - Recommend PT order --              Barriers to discharge:  Disposition Plan:  Home Status is: Inpt  Objective: Blood pressure 138/79, pulse 97, temperature 100 F (37.8 C), resp. rate 14, height 6\' 1"  (1.854 m), weight 90.7 kg, SpO2 97 %.  Examination:  Physical Exam Constitutional:      Comments: Grossly confused appearing elderly gentleman lying in bed uncomfortable from abdominal pain but no acute distress  HENT:     Head: Normocephalic and atraumatic.     Mouth/Throat:     Mouth: Mucous membranes are dry.  Eyes:     Extraocular Movements: Extraocular movements intact.  Cardiovascular:     Rate and Rhythm: Normal rate and regular rhythm.  Pulmonary:     Effort: Pulmonary effort is normal.     Breath  sounds: Normal breath sounds.  Abdominal:     General: Bowel sounds are decreased. There is distension.     Tenderness: There is generalized abdominal tenderness.  Musculoskeletal:        General: Normal range of motion.     Cervical back: Normal range of motion and neck supple.  Skin:    General: Skin is warm and dry.  Neurological:     Mental Status: He is disoriented.      Consultants:  GI Cardiology Nephrology  Procedures:    Data Reviewed: Results for orders placed or performed during the hospital encounter of 07/14/2022 (from the past 24 hour(s))  Comprehensive metabolic panel     Status: Abnormal   Collection Time: 07/17/22  3:13 PM  Result Value Ref Range   Sodium 142 135 - 145 mmol/L   Potassium 5.1 3.5 - 5.1 mmol/L   Chloride 117 (H) 98 - 111 mmol/L   CO2 14 (L) 22 - 32 mmol/L   Glucose, Bld 155 (H) 70 - 99 mg/dL   BUN 46 (H) 8 - 23 mg/dL   Creatinine, Ser 1.61 (H) 0.61 - 1.24 mg/dL  Calcium 4.7 (LL) 8.9 - 10.3 mg/dL   Total Protein 5.7 (L) 6.5 - 8.1 g/dL   Albumin 2.9 (L) 3.5 - 5.0 g/dL   AST 401 (H) 15 - 41 U/L   ALT 276 (H) 0 - 44 U/L   Alkaline Phosphatase 52 38 - 126 U/L   Total Bilirubin 0.6 0.3 - 1.2 mg/dL   GFR, Estimated 14 (L) >60 mL/min   Anion gap 11 5 - 15  Glucose, capillary     Status: Abnormal   Collection Time: 07/17/22  3:54 PM  Result Value Ref Range   Glucose-Capillary 157 (H) 70 - 99 mg/dL  Lactic acid, plasma     Status: Abnormal   Collection Time: 07/17/22  4:44 PM  Result Value Ref Range   Lactic Acid, Venous 6.6 (HH) 0.5 - 1.9 mmol/L  Basic metabolic panel     Status: Abnormal   Collection Time: 07/17/22  4:44 PM  Result Value Ref Range   Sodium 144 135 - 145 mmol/L   Potassium 4.4 3.5 - 5.1 mmol/L   Chloride 116 (H) 98 - 111 mmol/L   CO2 14 (L) 22 - 32 mmol/L   Glucose, Bld 179 (H) 70 - 99 mg/dL   BUN 47 (H) 8 - 23 mg/dL   Creatinine, Ser 0.27 (H) 0.61 - 1.24 mg/dL   Calcium 4.8 (LL) 8.9 - 10.3 mg/dL   GFR, Estimated 13  (L) >60 mL/min   Anion gap 14 5 - 15  Glucose, capillary     Status: Abnormal   Collection Time: 07/17/22  8:18 PM  Result Value Ref Range   Glucose-Capillary 138 (H) 70 - 99 mg/dL  Lactic acid, plasma     Status: Abnormal   Collection Time: 07/17/22  9:16 PM  Result Value Ref Range   Lactic Acid, Venous 4.7 (HH) 0.5 - 1.9 mmol/L  Comprehensive metabolic panel     Status: Abnormal   Collection Time: 07/17/22  9:16 PM  Result Value Ref Range   Sodium 145 135 - 145 mmol/L   Potassium 4.7 3.5 - 5.1 mmol/L   Chloride 115 (H) 98 - 111 mmol/L   CO2 15 (L) 22 - 32 mmol/L   Glucose, Bld 140 (H) 70 - 99 mg/dL   BUN 48 (H) 8 - 23 mg/dL   Creatinine, Ser 2.53 (H) 0.61 - 1.24 mg/dL   Calcium 5.1 (LL) 8.9 - 10.3 mg/dL   Total Protein 5.2 (L) 6.5 - 8.1 g/dL   Albumin 2.6 (L) 3.5 - 5.0 g/dL   AST 664 (H) 15 - 41 U/L   ALT 278 (H) 0 - 44 U/L   Alkaline Phosphatase 50 38 - 126 U/L   Total Bilirubin 0.6 0.3 - 1.2 mg/dL   GFR, Estimated 13 (L) >60 mL/min   Anion gap 15 5 - 15  Phosphorus     Status: None   Collection Time: 07/17/22  9:16 PM  Result Value Ref Range   Phosphorus 2.9 2.5 - 4.6 mg/dL  Blood gas, venous     Status: Abnormal   Collection Time: 07/17/22 11:06 PM  Result Value Ref Range   pH, Ven 7.2 (L) 7.25 - 7.43   pCO2, Ven 46 44 - 60 mmHg   pO2, Ven <31 (LL) 32 - 45 mmHg   Bicarbonate 18.0 (L) 20.0 - 28.0 mmol/L   Acid-base deficit 9.9 (H) 0.0 - 2.0 mmol/L   O2 Saturation 37.5 %   Patient temperature 36.8    Collection site  BLOOD RIGHT HAND    Drawn by [id number]   Glucose, capillary     Status: Abnormal   Collection Time: 07/18/22 12:51 AM  Result Value Ref Range   Glucose-Capillary 119 (H) 70 - 99 mg/dL  Lactic acid, plasma     Status: Abnormal   Collection Time: 07/18/22  1:50 AM  Result Value Ref Range   Lactic Acid, Venous 2.9 (HH) 0.5 - 1.9 mmol/L  Ammonia     Status: None   Collection Time: 07/18/22  1:50 AM  Result Value Ref Range   Ammonia 25 9 - 35 umol/L  CBC  with Differential/Platelet     Status: Abnormal   Collection Time: 07/18/22  3:04 AM  Result Value Ref Range   WBC 12.3 (H) 4.0 - 10.5 K/uL   RBC 4.80 4.22 - 5.81 MIL/uL   Hemoglobin 12.5 (L) 13.0 - 17.0 g/dL   HCT 29.5 18.8 - 41.6 %   MCV 81.7 80.0 - 100.0 fL   MCH 26.0 26.0 - 34.0 pg   MCHC 31.9 30.0 - 36.0 g/dL   RDW 60.6 30.1 - 60.1 %   Platelets 167 150 - 400 K/uL   nRBC 0.0 0.0 - 0.2 %   Neutrophils Relative % 80 %   Neutro Abs 9.8 (H) 1.7 - 7.7 K/uL   Lymphocytes Relative 10 %   Lymphs Abs 1.2 0.7 - 4.0 K/uL   Monocytes Relative 9 %   Monocytes Absolute 1.1 (H) 0.1 - 1.0 K/uL   Eosinophils Relative 1 %   Eosinophils Absolute 0.1 0.0 - 0.5 K/uL   Basophils Relative 0 %   Basophils Absolute 0.0 0.0 - 0.1 K/uL   WBC Morphology VACUOLATED NEUTROPHILS    Immature Granulocytes 0 %   Abs Immature Granulocytes 0.03 0.00 - 0.07 K/uL  Magnesium     Status: None   Collection Time: 07/18/22  3:04 AM  Result Value Ref Range   Magnesium 1.8 1.7 - 2.4 mg/dL  Hepatic function panel     Status: Abnormal   Collection Time: 07/18/22  3:04 AM  Result Value Ref Range   Total Protein 5.1 (L) 6.5 - 8.1 g/dL   Albumin 2.5 (L) 3.5 - 5.0 g/dL   AST 093 (H) 15 - 41 U/L   ALT 253 (H) 0 - 44 U/L   Alkaline Phosphatase 48 38 - 126 U/L   Total Bilirubin 0.7 0.3 - 1.2 mg/dL   Bilirubin, Direct 0.6 (H) 0.0 - 0.2 mg/dL   Indirect Bilirubin 0.1 (L) 0.3 - 0.9 mg/dL  Lipase, blood     Status: Abnormal   Collection Time: 07/18/22  3:04 AM  Result Value Ref Range   Lipase 393 (H) 11 - 51 U/L  Renal function panel     Status: Abnormal   Collection Time: 07/18/22  3:05 AM  Result Value Ref Range   Sodium 141 135 - 145 mmol/L   Potassium 4.3 3.5 - 5.1 mmol/L   Chloride 110 98 - 111 mmol/L   CO2 16 (L) 22 - 32 mmol/L   Glucose, Bld 146 (H) 70 - 99 mg/dL   BUN 52 (H) 8 - 23 mg/dL   Creatinine, Ser 2.35 (H) 0.61 - 1.24 mg/dL   Calcium 4.9 (LL) 8.9 - 10.3 mg/dL   Phosphorus 3.3 2.5 - 4.6 mg/dL    Albumin 2.5 (L) 3.5 - 5.0 g/dL   GFR, Estimated 12 (L) >60 mL/min   Anion gap 15 5 - 15  Lactic acid, plasma  Status: Abnormal   Collection Time: 07/18/22  4:53 AM  Result Value Ref Range   Lactic Acid, Venous 2.7 (HH) 0.5 - 1.9 mmol/L  Comprehensive metabolic panel     Status: Abnormal   Collection Time: 07/18/22  9:44 AM  Result Value Ref Range   Sodium 140 135 - 145 mmol/L   Potassium 3.9 3.5 - 5.1 mmol/L   Chloride 108 98 - 111 mmol/L   CO2 19 (L) 22 - 32 mmol/L   Glucose, Bld 137 (H) 70 - 99 mg/dL   BUN 57 (H) 8 - 23 mg/dL   Creatinine, Ser 1.61 (H) 0.61 - 1.24 mg/dL   Calcium 4.8 (LL) 8.9 - 10.3 mg/dL   Total Protein 5.0 (L) 6.5 - 8.1 g/dL   Albumin 2.5 (L) 3.5 - 5.0 g/dL   AST 096 (H) 15 - 41 U/L   ALT 220 (H) 0 - 44 U/L   Alkaline Phosphatase 51 38 - 126 U/L   Total Bilirubin 0.6 0.3 - 1.2 mg/dL   GFR, Estimated 11 (L) >60 mL/min   Anion gap 13 5 - 15  Troponin I (High Sensitivity)     Status: Abnormal   Collection Time: 07/18/22  9:44 AM  Result Value Ref Range   Troponin I (High Sensitivity) 480 (HH) <18 ng/L  Glucose, capillary     Status: Abnormal   Collection Time: 07/18/22 12:05 PM  Result Value Ref Range   Glucose-Capillary 150 (H) 70 - 99 mg/dL    I have reviewed pertinent nursing notes, vitals, labs, and images as necessary. I have ordered labwork to follow up on as indicated.  I have reviewed the last notes from staff over past 24 hours. I have discussed patient's care plan and test results with nursing staff, CM/SW, and other staff as appropriate.  Time spent: Greater than 50% of the 55 minute visit was spent in counseling/coordination of care for the patient as laid out in the A&P.   LOS: 2 days   Lewie Chamber, MD [facility] 07/18/2022, 2:31 PM

## 2022-07-19 DIAGNOSIS — K851 Biliary acute pancreatitis without necrosis or infection: Secondary | ICD-10-CM | POA: Diagnosis not present

## 2022-07-19 LAB — HEPATIC FUNCTION PANEL
ALT: 173 U/L — ABNORMAL HIGH (ref 0–44)
AST: 245 U/L — ABNORMAL HIGH (ref 15–41)
Albumin: 2.6 g/dL — ABNORMAL LOW (ref 3.5–5.0)
Alkaline Phosphatase: 57 U/L (ref 38–126)
Bilirubin, Direct: 0.7 mg/dL — ABNORMAL HIGH (ref 0.0–0.2)
Indirect Bilirubin: 0.2 mg/dL — ABNORMAL LOW (ref 0.3–0.9)
Total Bilirubin: 0.9 mg/dL (ref 0.3–1.2)
Total Protein: 5.6 g/dL — ABNORMAL LOW (ref 6.5–8.1)

## 2022-07-19 LAB — RENAL FUNCTION PANEL
Albumin: 2.5 g/dL — ABNORMAL LOW (ref 3.5–5.0)
Albumin: 2.7 g/dL — ABNORMAL LOW (ref 3.5–5.0)
Albumin: 2.4 g/dL — ABNORMAL LOW (ref 3.5–5.0)
Anion gap: 15 (ref 5–15)
Anion gap: 16 — ABNORMAL HIGH (ref 5–15)
Anion gap: 13 (ref 5–15)
BUN: 53 mg/dL — ABNORMAL HIGH (ref 8–23)
BUN: 42 mg/dL — ABNORMAL HIGH (ref 8–23)
BUN: 37 mg/dL — ABNORMAL HIGH (ref 8–23)
CO2: 23 mmol/L (ref 22–32)
CO2: 19 mmol/L — ABNORMAL LOW (ref 22–32)
CO2: 22 mmol/L (ref 22–32)
Calcium: 6 mg/dL — CL (ref 8.9–10.3)
Calcium: 5.9 mg/dL — CL (ref 8.9–10.3)
Calcium: 5.9 mg/dL — CL (ref 8.9–10.3)
Chloride: 103 mmol/L (ref 98–111)
Chloride: 103 mmol/L (ref 98–111)
Chloride: 102 mmol/L (ref 98–111)
Creatinine, Ser: 4.42 mg/dL — ABNORMAL HIGH (ref 0.61–1.24)
Creatinine, Ser: 3.65 mg/dL — ABNORMAL HIGH (ref 0.61–1.24)
Creatinine, Ser: 3.27 mg/dL — ABNORMAL HIGH (ref 0.61–1.24)
GFR, Estimated: 13 mL/min — ABNORMAL LOW (ref >60)
GFR, Estimated: 17 mL/min — ABNORMAL LOW (ref >60)
GFR, Estimated: 19 mL/min — ABNORMAL LOW (ref >60)
Glucose, Bld: 140 mg/dL — ABNORMAL HIGH (ref 70–99)
Glucose, Bld: 137 mg/dL — ABNORMAL HIGH (ref 70–99)
Glucose, Bld: 146 mg/dL — ABNORMAL HIGH (ref 70–99)
Phosphorus: 3.9 mg/dL (ref 2.5–4.6)
Phosphorus: 3.4 mg/dL (ref 2.5–4.6)
Phosphorus: 3 mg/dL (ref 2.5–4.6)
Potassium: 3.9 mmol/L (ref 3.5–5.1)
Potassium: 3.6 mmol/L (ref 3.5–5.1)
Potassium: 3.5 mmol/L (ref 3.5–5.1)
Sodium: 141 mmol/L (ref 135–145)
Sodium: 138 mmol/L (ref 135–145)
Sodium: 137 mmol/L (ref 135–145)

## 2022-07-19 LAB — GLUCOSE, CAPILLARY
Glucose-Capillary: 115 mg/dL — ABNORMAL HIGH (ref 70–99)
Glucose-Capillary: 120 mg/dL — ABNORMAL HIGH (ref 70–99)
Glucose-Capillary: 122 mg/dL — ABNORMAL HIGH (ref 70–99)
Glucose-Capillary: 123 mg/dL — ABNORMAL HIGH (ref 70–99)
Glucose-Capillary: 124 mg/dL — ABNORMAL HIGH (ref 70–99)
Glucose-Capillary: 130 mg/dL — ABNORMAL HIGH (ref 70–99)
Glucose-Capillary: 145 mg/dL — ABNORMAL HIGH (ref 70–99)

## 2022-07-19 LAB — CBC WITH DIFFERENTIAL/PLATELET
Abs Immature Granulocytes: 0.09 10*3/uL — ABNORMAL HIGH (ref 0.00–0.07)
Basophils Absolute: 0.1 10*3/uL (ref 0.0–0.1)
Basophils Relative: 1 %
Eosinophils Absolute: 0.1 10*3/uL (ref 0.0–0.5)
Eosinophils Relative: 0 %
HCT: 36.2 % — ABNORMAL LOW (ref 39.0–52.0)
Hemoglobin: 11.9 g/dL — ABNORMAL LOW (ref 13.0–17.0)
Immature Granulocytes: 1 %
Lymphocytes Relative: 10 %
Lymphs Abs: 1.3 10*3/uL (ref 0.7–4.0)
MCH: 25.8 pg — ABNORMAL LOW (ref 26.0–34.0)
MCHC: 32.9 g/dL (ref 30.0–36.0)
MCV: 78.4 fL — ABNORMAL LOW (ref 80.0–100.0)
Monocytes Absolute: 0.9 10*3/uL (ref 0.1–1.0)
Monocytes Relative: 7 %
Neutro Abs: 10.7 10*3/uL — ABNORMAL HIGH (ref 1.7–7.7)
Neutrophils Relative %: 81 %
Platelets: 127 10*3/uL — ABNORMAL LOW (ref 150–400)
RBC: 4.62 MIL/uL (ref 4.22–5.81)
RDW: 15.9 % — ABNORMAL HIGH (ref 11.5–15.5)
WBC: 13.2 10*3/uL — ABNORMAL HIGH (ref 4.0–10.5)
nRBC: 0.2 % (ref 0.0–0.2)

## 2022-07-19 LAB — ECHOCARDIOGRAM COMPLETE
Area-P 1/2: 3.99 cm2
Est EF: >75
S' Lateral: 2.4 cm
Weight: 3200 oz

## 2022-07-19 LAB — MAGNESIUM
Magnesium: 2.1 mg/dL (ref 1.7–2.4)
Magnesium: 2 mg/dL (ref 1.7–2.4)

## 2022-07-19 LAB — TROPONIN I (HIGH SENSITIVITY)
Troponin I (High Sensitivity): 174 ng/L (ref <18)
Troponin I (High Sensitivity): 126 ng/L (ref <18)

## 2022-07-19 MED ORDER — METOPROLOL TARTRATE 12.5 MG HALF TABLET
12.5000 mg | ORAL_TABLET | Freq: Two times a day (BID) | ORAL | Status: DC
  Administered 2022-07-19 – 2022-07-20 (×3): 12.5 mg via ORAL
  Filled 2022-07-19 (×4): qty 1

## 2022-07-19 MED ORDER — CALCIUM GLUCONATE-NACL 2-0.675 GM/100ML-% IV SOLN
2.0000 g | INTRAVENOUS | Status: AC
  Administered 2022-07-19 (×2): 2000 mg via INTRAVENOUS
  Filled 2022-07-19 (×2): qty 100

## 2022-07-19 MED ORDER — SODIUM CHLORIDE 0.9 % IV SOLN
250.0000 mL | INTRAVENOUS | Status: DC
  Administered 2022-07-19: 250 mL via INTRAVENOUS

## 2022-07-19 MED ORDER — CALCIUM GLUCONATE-NACL 2-0.675 GM/100ML-% IV SOLN
2.0000 g | Freq: Once | INTRAVENOUS | Status: AC
  Administered 2022-07-19: 2000 mg via INTRAVENOUS
  Filled 2022-07-19: qty 100

## 2022-07-19 MED ORDER — NOREPINEPHRINE 4 MG/250ML-% IV SOLN: 2.0000 ug/min | INTRAVENOUS | Status: DC

## 2022-07-19 MED ORDER — NOREPINEPHRINE 4 MG/250ML-% IV SOLN
0.0000 ug/min | INTRAVENOUS | Status: DC
  Administered 2022-07-19: 2 ug/min via INTRAVENOUS
  Filled 2022-07-19: qty 250

## 2022-07-19 NOTE — Plan of Care (Signed)

## 2022-07-19 NOTE — Progress Notes (Signed)
eLink Physician-Brief Progress Note Patient Name: Richard Whitaker DOB: [birth year] MRN: [id number]   Date of Service  07/19/2022  HPI/Events of Note  Notified of labile blood pressures while on CRRT.    BP 85/45, HR 91, RR 12, O2 sats 98% on naal cannula.    eICU Interventions  Start on levophed to keep MAP >65.     Intervention Category Intermediate Interventions: Hypotension - evaluation and management  Larinda Buttery 07/19/2022, 10:14 PM  12:40 AM Received request to renew bilateral wrist restraints.   Plan> Wrist restraints renewed.

## 2022-07-19 NOTE — Progress Notes (Signed)
[facility] Gastroenterology Progress Note  Richard Whitaker 77 y.o. 1945/04/09   Subjective: Awake. Lethargic. Altered mental status. Son and wife at bedside.  Objective: Vital signs: Vitals:   07/19/22 0905 07/19/22 1000  BP: (!) 163/84 (!) 143/59  Pulse:  (!) 34  Resp: 17 17  Temp: (!) 100.6 F (38.1 C) 99.5 F (37.5 C)  SpO2:  100%    Physical Exam: Gen: Somnolent, ill-appearing, well-nourished,   HEENT: anicteric sclera, nasoenteric tube CV: RRR Chest: CTA B Abd: diffuse tenderness with severe guarding with light palpation, distended, +BS Ext: no edema  Lab Results: Recent Labs    07/19/22 0034 07/19/22 0810 07/19/22 0811  NA 141  --  138  K 3.9  --  3.6  CL 103  --  103  CO2 23  --  19*  GLUCOSE 140*  --  137*  BUN 53*  --  42*  CREATININE 4.42*  --  3.65*  CALCIUM 6.0*  --  5.9*  MG 2.1 2.0  --   PHOS 3.9  --  3.4   Recent Labs    07/18/22 0944 07/18/22 1729 07/19/22 0810 07/19/22 0811  AST 460*  --  245*  --   ALT 220*  --  173*  --   ALKPHOS 51  --  57  --   BILITOT 0.6  --  0.9  --   PROT 5.0*  --  5.6*  --   ALBUMIN 2.5*   < > 2.6* 2.7*   < > = values in this interval not displayed.   Recent Labs    07/18/22 0304 07/19/22 0810  WBC 12.3* 13.2*  NEUTROABS 9.8* 10.7*  HGB 12.5* 11.9*  HCT 39.2 36.2*  MCV 81.7 78.4*  PLT 167 127*      Assessment/Plan: Acute pancreatitis following an EGD that included snare cautery of duodenal polyps (at [facility]) per EGD report (no pictures available to me). Acute renal failure on CRRT. Nasoenteric tube for nutrition. Continue supportive care. Will follow.   Richard Whitaker 07/19/2022, 11:42 AM  Questions please call [phone] ID: Richard Whitaker, male   DOB: 1946/01/09, 77 y.o.   MRN: [id number]

## 2022-07-19 NOTE — Progress Notes (Signed)
eLink Physician-Brief Progress Note Patient Name: MARCAS BOWSHER DOB: 13-Jul-1945 MRN: [id number]   Date of Service  07/19/2022  HPI/Events of Note  Corrected calcium 7.2 mg / dl.  eICU Interventions  Calcium gluconate 2 gm iv bolus ordered.        Migdalia Dk 07/19/2022, 2:34 AM

## 2022-07-19 NOTE — Consult Note (Signed)
NAME:  Richard Whitaker, MRN:  [id number], DOB:  [birth year], LOS: 3 ADMISSION DATE:  12-Aug-2022, CONSULTATION DATE:  07/18/22 REFERRING MD:  Frederick Peers - [facility] , CHIEF COMPLAINT:  abd pain   History of Present Illness:  [name] PMH DM2 HTN who presented to [facility] ED 2022/08/12 with abd pain and vomiting. On 6/18 pt underwent EGD with duodenal polyp bx and cautery. Workup in ED revealed elevated Lipase and LFTs, as well as CT findings concerning for acute pancreatitis.  Admitted to [facility] and GI consulted. On 6/20 he had worse renal failure, associated hyperkalemia and acidosis. Nephro consulted. 6/21 worse renal fxn, mentation, and minimal UOP -- plan for CRRT initiation   PCCM consulted in this setting   Pertinent  Medical History  Duodenal polyp HTN DM2  Prior tobacco use Hx prostate Ca, hodgkins dz  HLD Anemia  Significant Hospital Events: Including procedures, antibiotic start and stop dates in addition to other pertinent events   6/18 EGD with duodenal polyp biopsy + incomplete resection at [facility]. Started vomiting at home after  2022-08-12 [facility] admit for acute pancreatitis. GI consult  6/20 worse renal fxn + lyte abnormalities and acidosis. Nephro consult   6/21 worse mentation, renal fxn. Lytes ok acidosis improving on bicarb gtt. PCCM consult for CRRT  Interim History / Subjective:  Worse renal fxn and minimal UOP   Objective   Blood pressure 120/77, pulse (!) 107, temperature 100 F (37.8 C), temperature source Bladder, resp. rate 11, height 6\' 1"  (1.854 m), weight 99.7 kg, SpO2 98 %.        Intake/Output Summary (Last 24 hours) at 07/19/2022 0843 Last data filed at 07/19/2022 0800 Gross per 24 hour  Intake 4828.78 ml  Output 3131.6 ml  Net 1697.18 ml    Filed Weights   2022/08/12 0558 07/19/22 0500  Weight: 90.7 kg 99.7 kg    Examination: General: wdwn ill appearing M NAD  HENT: NCAT Anicteric sclera Lungs: CTAb deep respirations  Cardiovascular: tachycardic cap refill < 3 sec   Abdomen: round slightly tense  Extremities: no acute joint deformity  Neuro: Asleep- recent pain medication - pupils 2mm  GU: foley with dark urine   Resolved Hospital Problem list     Assessment & Plan:   Severe acute pancreatitis: Personally postprocedural with biopsies near presumably the ampulla of Vader.  Procedure performed at the [facility]. -- Pain control with IV Dilaudid -- NG trickle tube feeds   Acute renal failure: Presumed ATN in the setting of third spacing, hypovolemia related to pancreatitis.  Essentially an uric. -- appreciate nephrology assistance -- Continue CRRT   Acute metabolic encephalopathy: Likely multifactorial in the setting of renal failure, pain, delirium -- Supportive care, trying to treat reversible causes   Hypocalcemia: Sign of severe pancreatitis, saponification, slightly improved -- R continue replacement   Diabetes: Sliding scale insulin  Aflutter: Rates 100s-110s. --Metoprolol PO BID, increase as needed   Hypertension: Exacerbated by pain, being cautious with resuming antihypertensives as to not drop blood pressure too low in effort to maximize perfusion to the kidneys --Start metop per tube as above  Best Practice (right click and "Reselect all SmartList Selections" daily)   Diet/type: clear liquids itrickle TF due to encephalopathy DVT prophylaxis: prophylactic heparin  GI prophylaxis: PPI Lines: Dialysis Catheter Foley:  Yes, and it is still needed Code Status:  full code Last date of multidisciplinary goals of care discussion [--]  Labs   CBC: Recent Labs  Lab 08-12-2022  1610 07/17/22 0302 07/18/22 0304  WBC 13.0* 20.9* 12.3*  NEUTROABS 11.0*  --  9.8*  HGB 16.6 15.9 12.5*  HCT 52.8* 52.6* 39.2  MCV 81.5 85.3 81.7  PLT 273 201 167     Basic Metabolic Panel: Recent Labs  Lab 07/17/22 1012 07/17/22 1301 07/17/22 2116 07/18/22 0304 07/18/22 0305 07/18/22 0944 07/18/22 1729 07/19/22 0034  NA  --    < > 145  --  141 140  141 141  K  --    < > 4.7  --  4.3 3.9 3.6 3.9  CL  --    < > 115*  --  110 108 104 103  CO2  --    < > 15*  --  16* 19* 22 23  GLUCOSE  --    < > 140*  --  146* 137* 146* 140*  BUN  --    < > 48*  --  52* 57* 62* 53*  CREATININE  --    < > 4.54*  --  4.84* 5.14* 5.43* 4.42*  CALCIUM  --    < > 5.1*  --  4.9* 4.8* 4.8* 6.0*  MG 1.2*  --   --  1.8  --   --   --  2.1  PHOS  --   --  2.9  --  3.3  --  4.1 3.9   < > = values in this interval not displayed.    GFR: Estimated Creatinine Clearance: 18.2 mL/min (A) (by C-G formula based on SCr of 4.42 mg/dL (H)). Recent Labs  Lab 07/02/2022 0624 07/25/2022 0650 07/17/22 0302 07/17/22 0754 07/17/22 1644 07/17/22 2116 07/18/22 0150 07/18/22 0304 07/18/22 0453  WBC 13.0*  --  20.9*  --   --   --   --  12.3*  --   LATICACIDVEN  --    < >  --    < > 6.6* 4.7* 2.9*  --  2.7*   < > = values in this interval not displayed.     Liver Function Tests: Recent Labs  Lab 07/17/22 1301 07/17/22 1513 07/17/22 2116 07/18/22 0304 07/18/22 0305 07/18/22 0944 07/18/22 1729 07/19/22 0034  AST 407* 567* 647* 589*  --  460*  --   --   ALT 223* 276* 278* 253*  --  220*  --   --   ALKPHOS 52 52 50 48  --  51  --   --   BILITOT 0.7 0.6 0.6 0.7  --  0.6  --   --   PROT 5.7* 5.7* 5.2* 5.1*  --  5.0*  --   --   ALBUMIN 2.8* 2.9* 2.6* 2.5* 2.5* 2.5* 2.2* 2.5*    Recent Labs  Lab 07/26/2022 0643 07/17/22 1012 07/18/22 0304  LIPASE 1,765* 982* 393*    Recent Labs  Lab 07/17/22 1301 07/18/22 0150  AMMONIA 40* 25     ABG    Component Value Date/Time   HCO3 18.0 (L) 07/17/2022 2306   TCO2 27 08/22/2013 1129   ACIDBASEDEF 9.9 (H) 07/17/2022 2306   O2SAT 37.5 07/17/2022 2306     Coagulation Profile: No results for input(s): "INR", "PROTIME" in the last 168 hours.  Cardiac Enzymes: No results for input(s): "CKTOTAL", "CKMB", "CKMBINDEX", "TROPONINI" in the last 168 hours.  HbA1C: Hgb A1c MFr Bld  Date/Time Value Ref Range Status   07/26/2022 06:29 AM 5.9 (H) 4.8 - 5.6 % Final    Comment:    (  NOTE) Pre diabetes:          5.7%-6.4%  Diabetes:              >6.4%  Glycemic control for   <7.0% adults with diabetes   08/22/2013 01:35 PM 11.2 (H) <5.7 % Final    Comment:    (NOTE)                                                                       According to the ADA Clinical Practice Recommendations for 2011, when HbA1c is used as a screening test:  >=6.5%   Diagnostic of Diabetes Mellitus           (if abnormal result is confirmed) 5.7-6.4%   Increased risk of developing Diabetes Mellitus References:Diagnosis and Classification of Diabetes Mellitus,Diabetes Care,2011,34(Suppl 1):S62-S69 and Standards of Medical Care in         Diabetes - 2011,Diabetes Care,2011,34 (Suppl 1):S11-S61.    CBG: Recent Labs  Lab 07/18/22 0746 07/18/22 1205 07/18/22 1703 07/18/22 1958 07/19/22 0013  GLUCAP 118* 150* 140* 99 122*     Review of Systems:   Unable due to encephalopathy   From chart review + n/v/abdominal pain   Past Medical History:  He,  has a past medical history of Arthritis, DM (diabetes mellitus) (HCC), Hypertension, Prostate cancer (HCC), and Reflux.   Surgical History:   Past Surgical History:  Procedure Laterality Date   hip replacement  Right 12/14/2017   TRANSURETHRAL RESECTION OF PROSTATE       Social History:   reports that he quit smoking about 18 years ago. His smoking use included cigarettes. He has never used smokeless tobacco. He reports current alcohol use. He reports that he does not use drugs.   Family History:  His family history is not on file.   Allergies Allergies  Allergen Reactions   Penicillins     unknown     Home Medications  Prior to Admission medications   Medication Sig Start Date End Date Taking? Authorizing Provider  aspirin EC 81 MG tablet Take 81 mg by mouth daily. Swallow whole.   Yes [provider]  atorvastatin (LIPITOR) 40 MG tablet  Take 40 mg by mouth daily.   Yes [provider]  ferrous sulfate 325 (65 FE) MG EC tablet Take 325 mg by mouth daily with breakfast.   Yes [provider]  glipiZIDE (GLUCOTROL) 10 MG tablet Take 10 mg by mouth daily before breakfast.   Yes [provider]  metFORMIN (GLUCOPHAGE) 1000 MG tablet Take 1,000 mg by mouth 2 (two) times daily with a meal.   Yes [provider]  metoprolol tartrate (LOPRESSOR) 50 MG tablet Take 25 mg by mouth 2 (two) times daily.   Yes [provider]  Multiple Vitamin (MULTIVITAMIN WITH MINERALS) TABS tablet Take 1 tablet by mouth daily.   Yes [provider]  pantoprazole (PROTONIX) 40 MG tablet Take 40 mg by mouth 2 (two) times daily.   Yes [provider]  triamterene-hydrochlorothiazide (MAXZIDE) 75-50 MG per tablet Take 0.5 tablets by mouth daily.   Yes [provider]  metFORMIN (GLUCOPHAGE) 500 MG tablet Take 1 tablet (500 mg total) by mouth 2 (two) times daily with  a meal. Patient not taking: Reported on 12/18/2017 08/22/13   Gilda Crease, MD     Critical care time:    CRITICAL CARE Performed by: Karren Burly   Total critical care time: 35 minutes  Critical care time was exclusive of separately billable procedures and treating other patients. Critical care was necessary to treat or prevent imminent or life-threatening deterioration.  Critical care was time spent personally by me on the following activities: development of treatment plan with patient and/or surrogate as well as nursing, discussions with consultants, evaluation of patient's response to treatment, examination of patient, obtaining history from patient or surrogate, ordering and performing treatments and interventions, ordering and review of laboratory studies, ordering and review of radiographic studies, pulse oximetry and re-evaluation of patient's condition.  Karren Burly, MD [facility]  Pulmonary/Critical Care Medicine Amion for contact info 07/19/2022, 8:43 AM

## 2022-07-19 NOTE — Progress Notes (Signed)
eLink Physician-Brief Progress Note Patient Name: Richard Whitaker DOB: Jun 27, 1945 MRN: [id number]   Date of Service  07/19/2022  HPI/Events of Note  Patient is on dialysis and needs restraints to prevent pulling out of vital lines.  eICU Interventions  Restraints order renewed.        Thomasene Lot  07/19/2022, 12:18 AM

## 2022-07-19 NOTE — Progress Notes (Signed)
[facility] Progress Note   Assessment/ Plan:   Assessment/Recommendations: Richard Whitaker is a 77 y.o. male with a past medical history HTN, DM2, and GERD who present w/ severe acute pancreatitis c/b severe oliguric aki    Severe oliguric AKI: Likely secondary to ATN associated with severe acute pancreatitis.  Urinalysis only with mild proteinuria - very little UOP and Cr continues to rise - sig electrolyte abnormalities - needs dialytic intervention- discussed with pt, wife, and son at bedside - started CRRT 6/21- anticipate another 24 hrs and then will see if needs to go to [facility] for IHD  Hyperkalemia:  - CRRT will correct   NAGMA/AGMA:  - CRRT will correct   Severe acute pancreatitis: Associated with recent EGD.    Hypocalcemia: Associated with pancreatitis.   - continue aggressive repletion   Elevated troponin: Likely related to demand.  Management per primary team.  Cardiology following   Acute metabolic encephalopathy: Likely related to multiple issues including elevated ammonia and acute illness.    Subjective:    Seen in room.  Very little urine output.  Cr up to 5.14, Ca 4.8.   Objective:   BP (!) 143/59   Pulse (!) 34   Temp 99.5 F (37.5 C)   Resp 17   Ht 6\' 1"  (1.854 m)   Wt 99.7 kg   SpO2 100%   BMI 29.00 kg/m   Intake/Output Summary (Last 24 hours) at 07/19/2022 1131 Last data filed at 07/19/2022 1000 Gross per 24 hour  Intake 4546.9 ml  Output 3380.6 ml  Net 1166.3 ml   Weight change:   Physical Exam: Gen:NAD, ill-appearing CVS: tachycardic Resp: RRR Abd: very tender in epigastric Ext: no LE edema  Imaging: ECHOCARDIOGRAM COMPLETE  Result Date: 07/19/2022    ECHOCARDIOGRAM REPORT   Patient Name:   Richard Whitaker Date of Exam: 07/18/2022 Medical Rec #:  [id number]         Height:       73.0 in Accession #:    [id number]        Weight:       200.0 lb Date of Birth:  Jul 08, 1945         BSA:          2.152 m Patient Age:    76  years          BP:           173/90 mmHg Patient Gender: M                 HR:           99 bpm. Exam Location:  Inpatient Procedure: 2D Echo, Cardiac Doppler, Color Doppler and Intracardiac            Opacification Agent Indications:     Elevated troponins  History:         Patient has no prior history of Echocardiogram examinations.                  Signs/Symptoms:Syncope; Risk Factors:Diabetes and Hypertension.                  Prostate CA.  Sonographer:     Milda Smart Referring Phys:  [id number] DAVID GIRGUIS Diagnosing Phys: Weston Brass MD  Sonographer Comments: Suboptimal apical window. Image acquisition challenging due to patient body habitus and Image acquisition challenging due to respiratory motion. Subcostal window not visible. Pt in severe abdominal pain and cannot tolerate  pressure from probe. IMPRESSIONS  1. Left ventricular ejection fraction, by estimation, is >75%. The left ventricle has hyperdynamic function. The left ventricle has no regional wall motion abnormalities. There is mild left ventricular hypertrophy. Left ventricular diastolic parameters are consistent with Grade I diastolic dysfunction (impaired relaxation).  2. Midcavitary/LVOT obstruction with max instantaneous gradient 38 mmHg (vmax 3 m/s).  3. Right ventricular systolic function is normal. The right ventricular size is normal. Tricuspid regurgitation signal is inadequate for assessing PA pressure.  4. The mitral valve is grossly normal. Trivial mitral valve regurgitation. No evidence of mitral stenosis.  5. The aortic valve is tricuspid. There is mild calcification of the aortic valve. Aortic valve regurgitation is not visualized. No aortic stenosis is present. FINDINGS  Left Ventricle: Left ventricular ejection fraction, by estimation, is >75%. The left ventricle has hyperdynamic function. The left ventricle has no regional wall motion abnormalities. Definity contrast agent was given IV to delineate the left ventricular  endocardial borders. The left ventricular internal cavity size was normal in size. There is mild left ventricular hypertrophy. Left ventricular diastolic parameters are consistent with Grade I diastolic dysfunction (impaired relaxation). Right Ventricle: The right ventricular size is normal. No increase in right ventricular wall thickness. Right ventricular systolic function is normal. Tricuspid regurgitation signal is inadequate for assessing PA pressure. Left Atrium: Left atrial size was normal in size. Right Atrium: Right atrial size was normal in size. Pericardium: Trivial pericardial effusion is present. Presence of epicardial fat layer. Mitral Valve: The mitral valve is grossly normal. Trivial mitral valve regurgitation. No evidence of mitral valve stenosis. Tricuspid Valve: The tricuspid valve is normal in structure. Tricuspid valve regurgitation is trivial. No evidence of tricuspid stenosis. Aortic Valve: The aortic valve is tricuspid. There is mild calcification of the aortic valve. Aortic valve regurgitation is not visualized. No aortic stenosis is present. Pulmonic Valve: The pulmonic valve was normal in structure. Pulmonic valve regurgitation is trivial. No evidence of pulmonic stenosis. Aorta: The aortic root is normal in size and structure. Venous: The inferior vena cava was not well visualized. IAS/Shunts: The interatrial septum was not well visualized.  LEFT VENTRICLE PLAX 2D LVIDd:         3.50 cm   Diastology LVIDs:         2.40 cm   LV e' medial:    5.00 cm/s LV PW:         1.10 cm   LV E/e' medial:  12.2 LV IVS:        1.34 cm   LV e' lateral:   4.79 cm/s LVOT diam:     2.10 cm   LV E/e' lateral: 12.7 LVOT Area:     3.46 cm  RIGHT VENTRICLE RV Basal diam:  3.60 cm RV S prime:     23.90 cm/s TAPSE (M-mode): 1.9 cm LEFT ATRIUM             Index        RIGHT ATRIUM          Index LA diam:        3.30 cm 1.53 cm/m   RA Area:     7.09 cm LA Vol (A2C):   34.3 ml 15.94 ml/m  RA Volume:   8.60 ml   4.00 ml/m LA Vol (A4C):   36.9 ml 17.15 ml/m LA Biplane Vol: 37.1 ml 17.24 ml/m   AORTA Ao Root diam: 3.50 cm Ao Asc diam:  3.50 cm MITRAL VALVE  TRICUSPID VALVE MV Area (PHT): 3.99 cm    TR Peak grad:   4.8 mmHg MV Decel Time: 190 msec    TR Vmax:        109.00 cm/s MV E velocity: 60.80 cm/s MV A velocity: 97.30 cm/s  SHUNTS MV E/A ratio:  0.62        Systemic Diam: 2.10 cm Weston Brass MD Electronically signed by Weston Brass MD Signature Date/Time: 07/19/2022/5:15:13 AM    Final (Updated)    DG Abd 1 View  Result Date: 07/18/2022 CLINICAL DATA:  Placement of feeding tube EXAM: ABDOMEN - 1 VIEW COMPARISON:  06/29/2022 FINDINGS: Tip of feeding tube is seen in the region of body of stomach. Bowel gas pattern is nonspecific. Catheter is seen in the course of right iliac vessels. IMPRESSION: Tip of feeding tube is seen in the region of body of stomach. Nonspecific bowel gas pattern. Electronically Signed   By: Ernie Avena M.D.   On: 07/18/2022 17:13   DG CHEST PORT 1 VIEW  Result Date: 07/18/2022 CLINICAL DATA:  Fevers EXAM: PORTABLE CHEST 1 VIEW COMPARISON:  07/03/2022 FINDINGS: Cardiac shadow is stable. Overall inspiratory effort is poor. Left retrocardiac consolidation is noted with associated small effusion. No bony abnormality is noted. IMPRESSION: Increasing left retrocardiac consolidation with small effusion. Electronically Signed   By: Alcide Clever M.D.   On: 07/18/2022 10:01    Labs: BMET Recent Labs  Lab 07/17/22 1644 07/17/22 2116 07/18/22 0305 07/18/22 0944 07/18/22 1729 07/19/22 0034 07/19/22 0811  NA 144 145 141 140 141 141 138  K 4.4 4.7 4.3 3.9 3.6 3.9 3.6  CL 116* 115* 110 108 104 103 103  CO2 14* 15* 16* 19* 22 23 19*  GLUCOSE 179* 140* 146* 137* 146* 140* 137*  BUN 47* 48* 52* 57* 62* 53* 42*  CREATININE 4.48* 4.54* 4.84* 5.14* 5.43* 4.42* 3.65*  CALCIUM 4.8* 5.1* 4.9* 4.8* 4.8* 6.0* 5.9*  PHOS  --  2.9 3.3  --  4.1 3.9 3.4   CBC Recent  Labs  Lab 07/08/2022 0624 07/17/22 0302 07/18/22 0304 07/19/22 0810  WBC 13.0* 20.9* 12.3* 13.2*  NEUTROABS 11.0*  --  9.8* 10.7*  HGB 16.6 15.9 12.5* 11.9*  HCT 52.8* 52.6* 39.2 36.2*  MCV 81.5 85.3 81.7 78.4*  PLT 273 201 167 127*    Medications:     Chlorhexidine Gluconate Cloth  6 each Topical Daily   heparin  5,000 Units Subcutaneous Q8H   insulin aspart  0-6 Units Subcutaneous Q4H   metoprolol tartrate  12.5 mg Oral BID   mouth rinse  15 mL Mouth Rinse 4 times per day   pantoprazole (PROTONIX) IV  40 mg Intravenous Q24H   sodium zirconium cyclosilicate  20 g Oral Once    Bufford Buttner MD 07/19/2022, 11:31 AM

## 2022-07-20 ENCOUNTER — Inpatient Hospital Stay (HOSPITAL_COMMUNITY): Payer: No Typology Code available for payment source

## 2022-07-20 DIAGNOSIS — K851 Biliary acute pancreatitis without necrosis or infection: Secondary | ICD-10-CM | POA: Diagnosis not present

## 2022-07-20 LAB — CBC WITH DIFFERENTIAL/PLATELET
Abs Immature Granulocytes: 0.45 10*3/uL — ABNORMAL HIGH (ref 0.00–0.07)
Basophils Absolute: 0.1 10*3/uL (ref 0.0–0.1)
Basophils Relative: 1 %
Eosinophils Absolute: 0 10*3/uL (ref 0.0–0.5)
Eosinophils Relative: 0 %
HCT: 32.6 % — ABNORMAL LOW (ref 39.0–52.0)
Hemoglobin: 10.4 g/dL — ABNORMAL LOW (ref 13.0–17.0)
Immature Granulocytes: 3 %
Lymphocytes Relative: 8 %
Lymphs Abs: 1.1 10*3/uL (ref 0.7–4.0)
MCH: 25.7 pg — ABNORMAL LOW (ref 26.0–34.0)
MCHC: 31.9 g/dL (ref 30.0–36.0)
MCV: 80.5 fL (ref 80.0–100.0)
Monocytes Absolute: 1.4 10*3/uL — ABNORMAL HIGH (ref 0.1–1.0)
Monocytes Relative: 9 %
Neutro Abs: 11.4 10*3/uL — ABNORMAL HIGH (ref 1.7–7.7)
Neutrophils Relative %: 79 %
Platelets: 132 10*3/uL — ABNORMAL LOW (ref 150–400)
RBC: 4.05 MIL/uL — ABNORMAL LOW (ref 4.22–5.81)
RDW: 15.8 % — ABNORMAL HIGH (ref 11.5–15.5)
WBC: 14.5 10*3/uL — ABNORMAL HIGH (ref 4.0–10.5)
nRBC: 1.4 % — ABNORMAL HIGH (ref 0.0–0.2)

## 2022-07-20 LAB — COMPREHENSIVE METABOLIC PANEL
ALT: 476 U/L — ABNORMAL HIGH (ref 0–44)
AST: 704 U/L — ABNORMAL HIGH (ref 15–41)
Albumin: 2.1 g/dL — ABNORMAL LOW (ref 3.5–5.0)
Alkaline Phosphatase: 83 U/L (ref 38–126)
Anion gap: 17 — ABNORMAL HIGH (ref 5–15)
BUN: 26 mg/dL — ABNORMAL HIGH (ref 8–23)
CO2: 11 mmol/L — ABNORMAL LOW (ref 22–32)
Calcium: 6.4 mg/dL — CL (ref 8.9–10.3)
Chloride: 110 mmol/L (ref 98–111)
Creatinine, Ser: 2.17 mg/dL — ABNORMAL HIGH (ref 0.61–1.24)
GFR, Estimated: 31 mL/min — ABNORMAL LOW (ref >60)
Glucose, Bld: 126 mg/dL — ABNORMAL HIGH (ref 70–99)
Potassium: 3.8 mmol/L (ref 3.5–5.1)
Sodium: 138 mmol/L (ref 135–145)
Total Bilirubin: 1.2 mg/dL (ref 0.3–1.2)
Total Protein: 4.9 g/dL — ABNORMAL LOW (ref 6.5–8.1)

## 2022-07-20 LAB — MAGNESIUM
Magnesium: 2.3 mg/dL (ref 1.7–2.4)
Magnesium: 2.8 mg/dL — ABNORMAL HIGH (ref 1.7–2.4)

## 2022-07-20 LAB — RENAL FUNCTION PANEL
Albumin: 2.4 g/dL — ABNORMAL LOW (ref 3.5–5.0)
Albumin: 2.5 g/dL — ABNORMAL LOW (ref 3.5–5.0)
Anion gap: 12 (ref 5–15)
Anion gap: 13 (ref 5–15)
BUN: 31 mg/dL — ABNORMAL HIGH (ref 8–23)
BUN: 29 mg/dL — ABNORMAL HIGH (ref 8–23)
CO2: 22 mmol/L (ref 22–32)
CO2: 21 mmol/L — ABNORMAL LOW (ref 22–32)
Calcium: 6.1 mg/dL — CL (ref 8.9–10.3)
Calcium: 6.8 mg/dL — ABNORMAL LOW (ref 8.9–10.3)
Chloride: 101 mmol/L (ref 98–111)
Chloride: 103 mmol/L (ref 98–111)
Creatinine, Ser: 2.62 mg/dL — ABNORMAL HIGH (ref 0.61–1.24)
Creatinine, Ser: 2.32 mg/dL — ABNORMAL HIGH (ref 0.61–1.24)
GFR, Estimated: 25 mL/min — ABNORMAL LOW (ref >60)
GFR, Estimated: 29 mL/min — ABNORMAL LOW (ref >60)
Glucose, Bld: 148 mg/dL — ABNORMAL HIGH (ref 70–99)
Glucose, Bld: 141 mg/dL — ABNORMAL HIGH (ref 70–99)
Phosphorus: 2.7 mg/dL (ref 2.5–4.6)
Phosphorus: 2.6 mg/dL (ref 2.5–4.6)
Potassium: 3.8 mmol/L (ref 3.5–5.1)
Potassium: 4 mmol/L (ref 3.5–5.1)
Sodium: 135 mmol/L (ref 135–145)
Sodium: 137 mmol/L (ref 135–145)

## 2022-07-20 LAB — BLOOD GAS, ARTERIAL
Acid-base deficit: 17.7 mmol/L — ABNORMAL HIGH (ref 0.0–2.0)
Bicarbonate: 13.2 mmol/L — ABNORMAL LOW (ref 20.0–28.0)
Drawn by: 59133
FIO2: 100 %
MECHVT: 5 mL
O2 Saturation: 95.1 %
PEEP: 5 cmH2O
Patient temperature: 36.7
RATE: 18 resp/min
pCO2 arterial: 50 mmHg — ABNORMAL HIGH (ref 32–48)
pH, Arterial: 7.02 — CL (ref 7.35–7.45)
pO2, Arterial: 83 mmHg (ref 83–108)

## 2022-07-20 LAB — GLUCOSE, CAPILLARY
Glucose-Capillary: 132 mg/dL — ABNORMAL HIGH (ref 70–99)
Glucose-Capillary: 135 mg/dL — ABNORMAL HIGH (ref 70–99)
Glucose-Capillary: 145 mg/dL — ABNORMAL HIGH (ref 70–99)
Glucose-Capillary: 140 mg/dL — ABNORMAL HIGH (ref 70–99)
Glucose-Capillary: 117 mg/dL — ABNORMAL HIGH (ref 70–99)
Glucose-Capillary: 120 mg/dL — ABNORMAL HIGH (ref 70–99)

## 2022-07-20 LAB — HEPATIC FUNCTION PANEL
ALT: 124 U/L — ABNORMAL HIGH (ref 0–44)
AST: 138 U/L — ABNORMAL HIGH (ref 15–41)
Albumin: 2.4 g/dL — ABNORMAL LOW (ref 3.5–5.0)
Alkaline Phosphatase: 59 U/L (ref 38–126)
Bilirubin, Direct: 0.6 mg/dL — ABNORMAL HIGH (ref 0.0–0.2)
Indirect Bilirubin: 0 mg/dL — ABNORMAL LOW (ref 0.3–0.9)
Total Bilirubin: 0.6 mg/dL (ref 0.3–1.2)
Total Protein: 5.5 g/dL — ABNORMAL LOW (ref 6.5–8.1)

## 2022-07-20 LAB — CBC
HCT: 29.6 % — ABNORMAL LOW (ref 39.0–52.0)
Hemoglobin: 8.9 g/dL — ABNORMAL LOW (ref 13.0–17.0)
MCH: 26 pg (ref 26.0–34.0)
MCHC: 30.1 g/dL (ref 30.0–36.0)
MCV: 86.5 fL (ref 80.0–100.0)
Platelets: 124 10*3/uL — ABNORMAL LOW (ref 150–400)
RBC: 3.42 MIL/uL — ABNORMAL LOW (ref 4.22–5.81)
RDW: 16 % — ABNORMAL HIGH (ref 11.5–15.5)
WBC: 17.5 10*3/uL — ABNORMAL HIGH (ref 4.0–10.5)
nRBC: 8.2 % — ABNORMAL HIGH (ref 0.0–0.2)

## 2022-07-20 LAB — LACTIC ACID, PLASMA: Lactic Acid, Venous: >9 mmol/L (ref 0.5–1.9)

## 2022-07-20 MED ORDER — EPINEPHRINE HCL 5 MG/250ML IV SOLN IN NS
INTRAVENOUS | Status: AC
  Administered 2022-07-20: 5 mg
  Filled 2022-07-20: qty 250

## 2022-07-20 MED ORDER — VITAL 1.5 CAL PO LIQD
1000.0000 mL | ORAL | Status: DC
  Administered 2022-07-20: 1000 mL
  Filled 2022-07-20 (×2): qty 1000

## 2022-07-20 MED ORDER — PROSOURCE TF20 ENFIT COMPATIBL EN LIQD
60.0000 mL | Freq: Two times a day (BID) | ENTERAL | Status: DC
  Filled 2022-07-20: qty 60

## 2022-07-20 MED ORDER — SODIUM CHLORIDE 0.9 % IV SOLN: INTRAVENOUS | Status: DC | PRN

## 2022-07-20 MED ORDER — CALCIUM GLUCONATE-NACL 2-0.675 GM/100ML-% IV SOLN
2.0000 g | INTRAVENOUS | Status: AC
  Administered 2022-07-20 (×2): 2000 mg via INTRAVENOUS
  Filled 2022-07-20 (×2): qty 100

## 2022-07-20 MED ORDER — EPINEPHRINE 1 MG/10ML IJ SOSY
PREFILLED_SYRINGE | INTRAMUSCULAR | Status: AC
  Filled 2022-07-20: qty 50

## 2022-07-20 MED ORDER — SODIUM BICARBONATE 8.4 % IV SOLN
100.0000 meq | Freq: Once | INTRAVENOUS | Status: AC
  Administered 2022-07-20: 100 meq via INTRAVENOUS

## 2022-07-21 LAB — GLUCOSE, CAPILLARY: Glucose-Capillary: 136 mg/dL — ABNORMAL HIGH (ref 70–99)

## 2022-07-21 NOTE — Plan of Care (Signed)
Problem: Education: Goal: Knowledge of General Education information will improve Description: Including pain rating scale, medication(s)/side effects and non-pharmacologic comfort measures 07/25/2022 0009 by Justus Memory, RN Outcome: Not Met (add Reason) 07/18/2022 0005 by Justus Memory, RN Outcome: Progressing   Problem: Health Behavior/Discharge Planning: Goal: Ability to manage health-related needs will improve 07/27/2022 0009 by Justus Memory, RN Outcome: Not Met (add Reason) 07/14/2022 0005 by Justus Memory, RN Outcome: Progressing   Problem: Clinical Measurements: Goal: Ability to maintain clinical measurements within normal limits will improve 07/18/2022 0009 by Justus Memory, RN Outcome: Not Met (add Reason) 07/24/2022 0005 by Justus Memory, RN Outcome: Progressing Goal: Will remain free from infection 07/08/2022 0009 by Justus Memory, RN Outcome: Not Met (add Reason) 07/27/2022 0005 by Justus Memory, RN Outcome: Progressing Goal: Diagnostic test results will improve 07/23/2022 0009 by Justus Memory, RN Outcome: Not Met (add Reason) 06/29/2022 0005 by Justus Memory, RN Outcome: Progressing Goal: Respiratory complications will improve 07/06/2022 0009 by Justus Memory, RN Outcome: Not Met (add Reason) 07/11/2022 0005 by Justus Memory, RN Outcome: Progressing Goal: Cardiovascular complication will be avoided 07/17/2022 0009 by Justus Memory, RN Outcome: Not Met (add Reason) 07/13/2022 0005 by Justus Memory, RN Outcome: Progressing   Problem: Activity: Goal: Risk for activity intolerance will decrease 07/22/2022 0009 by Justus Memory, RN Outcome: Not Met (add Reason) 07/26/2022 0005 by Justus Memory, RN Outcome: Progressing   Problem: Nutrition: Goal: Adequate nutrition will be maintained 07/26/2022 0009 by Justus Memory, RN Outcome: Not Met (add Reason) 07/07/2022 0005 by Justus Memory, RN Outcome: Progressing    Problem: Coping: Goal: Level of anxiety will decrease 07/11/2022 0009 by Justus Memory, RN Outcome: Not Met (add Reason) 07/08/2022 0005 by Justus Memory, RN Outcome: Progressing   Problem: Elimination: Goal: Will not experience complications related to bowel motility 07/15/2022 0009 by Justus Memory, RN Outcome: Not Met (add Reason) 07/01/2022 0005 by Justus Memory, RN Outcome: Progressing Goal: Will not experience complications related to urinary retention 07/15/2022 0009 by Justus Memory, RN Outcome: Not Met (add Reason) 07/05/2022 0005 by Justus Memory, RN Outcome: Progressing   Problem: Pain Managment: Goal: General experience of comfort will improve 07/22/2022 0009 by Justus Memory, RN Outcome: Not Met (add Reason) 07/12/2022 0005 by Justus Memory, RN Outcome: Progressing   Problem: Safety: Goal: Ability to remain free from injury will improve 06/29/2022 0009 by Justus Memory, RN Outcome: Not Met (add Reason) 07/06/2022 0005 by Justus Memory, RN Outcome: Progressing   Problem: Skin Integrity: Goal: Risk for impaired skin integrity will decrease 07/18/2022 0009 by Justus Memory, RN Outcome: Not Met (add Reason) 06/30/2022 0005 by Justus Memory, RN Outcome: Progressing   Problem: Education: Goal: Ability to describe self-care measures that may prevent or decrease complications (Diabetes Survival Skills Education) will improve 07/10/2022 0009 by Justus Memory, RN Outcome: Not Met (add Reason) 07/27/2022 0005 by Justus Memory, RN Outcome: Progressing Goal: Individualized Educational Video(s) 07/11/2022 0009 by Justus Memory, RN Outcome: Not Met (add Reason) 07/18/2022 0005 by Justus Memory, RN Outcome: Progressing   Problem: Coping: Goal: Ability to adjust to condition or change in health will improve 07/14/2022 0009 by Justus Memory, RN Outcome: Not Met (add Reason) 07/09/2022 0005 by Justus Memory, RN Outcome:  Progressing   Problem: Fluid Volume: Goal: Ability to maintain a balanced intake and  output will improve 07/05/2022 0009 by Justus Memory, RN Outcome: Not Met (add Reason) 06/28/2022 0005 by Justus Memory, RN Outcome: Progressing   Problem: Health Behavior/Discharge Planning: Goal: Ability to identify and utilize available resources and services will improve 07/08/2022 0009 by Justus Memory, RN Outcome: Not Met (add Reason) 06/30/2022 0005 by Justus Memory, RN Outcome: Progressing Goal: Ability to manage health-related needs will improve 07/13/2022 0009 by Justus Memory, RN Outcome: Not Met (add Reason) 07/17/2022 0005 by Justus Memory, RN Outcome: Progressing   Problem: Metabolic: Goal: Ability to maintain appropriate glucose levels will improve 07/07/2022 0009 by Justus Memory, RN Outcome: Not Met (add Reason) 06/30/2022 0005 by Justus Memory, RN Outcome: Progressing   Problem: Nutritional: Goal: Maintenance of adequate nutrition will improve 06/28/2022 0009 by Justus Memory, RN Outcome: Not Met (add Reason) 07/07/2022 0005 by Justus Memory, RN Outcome: Progressing Goal: Progress toward achieving an optimal weight will improve 07/26/2022 0009 by Justus Memory, RN Outcome: Not Met (add Reason) 07/04/2022 0005 by Justus Memory, RN Outcome: Progressing   Problem: Skin Integrity: Goal: Risk for impaired skin integrity will decrease 07/22/2022 0009 by Justus Memory, RN Outcome: Not Met (add Reason) 07/11/2022 0005 by Justus Memory, RN Outcome: Progressing   Problem: Tissue Perfusion: Goal: Adequacy of tissue perfusion will improve 07/24/2022 0009 by Justus Memory, RN Outcome: Not Met (add Reason) 06/28/2022 0005 by Justus Memory, RN Outcome: Progressing   Problem: Safety: Goal: Non-violent Restraint(s) 07/25/2022 0009 by Justus Memory, RN Outcome: Not Met (add Reason) 06/28/2022 0005 by Justus Memory, RN Outcome:  Progressing  Patient coded

## 2022-07-21 NOTE — Plan of Care (Signed)
Problem: Education: Goal: Knowledge of General Education information will improve Description: Including pain rating scale, medication(s)/side effects and non-pharmacologic comfort measures 07/12/2022 0010 by Justus Memory, RN Outcome: Adequate for Discharge 07/24/2022 0009 by Justus Memory, RN Outcome: Not Met (add Reason) 07/19/2022 0005 by Justus Memory, RN Outcome: Progressing   Problem: Health Behavior/Discharge Planning: Goal: Ability to manage health-related needs will improve 07/18/2022 0010 by Justus Memory, RN Outcome: Adequate for Discharge 06/30/2022 0009 by Justus Memory, RN Outcome: Not Met (add Reason) 07/25/2022 0005 by Justus Memory, RN Outcome: Progressing   Problem: Clinical Measurements: Goal: Ability to maintain clinical measurements within normal limits will improve 07/24/2022 0010 by Justus Memory, RN Outcome: Adequate for Discharge 07/23/2022 0009 by Justus Memory, RN Outcome: Not Met (add Reason) 07/02/2022 0005 by Justus Memory, RN Outcome: Progressing Goal: Will remain free from infection 07/03/2022 0010 by Justus Memory, RN Outcome: Adequate for Discharge 06/29/2022 0009 by Justus Memory, RN Outcome: Not Met (add Reason) 07/25/2022 0005 by Justus Memory, RN Outcome: Progressing Goal: Diagnostic test results will improve 06/28/2022 0010 by Justus Memory, RN Outcome: Adequate for Discharge 07/27/2022 0009 by Justus Memory, RN Outcome: Not Met (add Reason) 07/01/2022 0005 by Justus Memory, RN Outcome: Progressing Goal: Respiratory complications will improve 07/17/2022 0010 by Justus Memory, RN Outcome: Adequate for Discharge 07/04/2022 0009 by Justus Memory, RN Outcome: Not Met (add Reason) 07/02/2022 0005 by Justus Memory, RN Outcome: Progressing Goal: Cardiovascular complication will be avoided 07/11/2022 0010 by Justus Memory, RN Outcome: Adequate for Discharge 07/18/2022 0009 by Justus Memory,  RN Outcome: Not Met (add Reason) 07/05/2022 0005 by Justus Memory, RN Outcome: Progressing   Problem: Activity: Goal: Risk for activity intolerance will decrease 07/01/2022 0010 by Justus Memory, RN Outcome: Adequate for Discharge 07/11/2022 0009 by Justus Memory, RN Outcome: Not Met (add Reason) 07/17/2022 0005 by Justus Memory, RN Outcome: Progressing   Problem: Nutrition: Goal: Adequate nutrition will be maintained 07/04/2022 0010 by Justus Memory, RN Outcome: Adequate for Discharge 07/22/2022 0009 by Justus Memory, RN Outcome: Not Met (add Reason) 07/07/2022 0005 by Justus Memory, RN Outcome: Progressing   Problem: Coping: Goal: Level of anxiety will decrease 07/10/2022 0010 by Justus Memory, RN Outcome: Adequate for Discharge 07/18/2022 0009 by Justus Memory, RN Outcome: Not Met (add Reason) 07/03/2022 0005 by Justus Memory, RN Outcome: Progressing   Problem: Elimination: Goal: Will not experience complications related to bowel motility 07/05/2022 0010 by Justus Memory, RN Outcome: Adequate for Discharge 06/29/2022 0009 by Justus Memory, RN Outcome: Not Met (add Reason) 07/27/2022 0005 by Justus Memory, RN Outcome: Progressing Goal: Will not experience complications related to urinary retention 07/17/2022 0010 by Justus Memory, RN Outcome: Adequate for Discharge 07/10/2022 0009 by Justus Memory, RN Outcome: Not Met (add Reason) 07/08/2022 0005 by Justus Memory, RN Outcome: Progressing   Problem: Pain Managment: Goal: General experience of comfort will improve 07/09/2022 0010 by Justus Memory, RN Outcome: Adequate for Discharge 07/04/2022 0009 by Justus Memory, RN Outcome: Not Met (add Reason) 07/08/2022 0005 by Justus Memory, RN Outcome: Progressing   Problem: Safety: Goal: Ability to remain free from injury will improve 07/24/2022 0010 by Justus Memory, RN Outcome: Adequate for Discharge 07/06/2022 0009 by  Justus Memory, RN Outcome: Not Met (add Reason) 06/30/2022 0005 by Justus Memory, RN  Outcome: Progressing   Problem: Skin Integrity: Goal: Risk for impaired skin integrity will decrease 07/07/2022 0010 by Justus Memory, RN Outcome: Adequate for Discharge 07/18/2022 0009 by Justus Memory, RN Outcome: Not Met (add Reason) 07/11/2022 0005 by Justus Memory, RN Outcome: Progressing   Problem: Education: Goal: Ability to describe self-care measures that may prevent or decrease complications (Diabetes Survival Skills Education) will improve 07/17/2022 0010 by Justus Memory, RN Outcome: Adequate for Discharge 07/12/2022 0009 by Justus Memory, RN Outcome: Not Met (add Reason) 07/14/2022 0005 by Justus Memory, RN Outcome: Progressing Goal: Individualized Educational Video(s) 07/26/2022 0010 by Justus Memory, RN Outcome: Adequate for Discharge 07/18/2022 0009 by Justus Memory, RN Outcome: Not Met (add Reason) 07/22/2022 0005 by Justus Memory, RN Outcome: Progressing   Problem: Coping: Goal: Ability to adjust to condition or change in health will improve 07/24/2022 0010 by Justus Memory, RN Outcome: Adequate for Discharge 07/19/2022 0009 by Justus Memory, RN Outcome: Not Met (add Reason) 07/17/2022 0005 by Justus Memory, RN Outcome: Progressing   Problem: Fluid Volume: Goal: Ability to maintain a balanced intake and output will improve 07/06/2022 0010 by Justus Memory, RN Outcome: Adequate for Discharge 07/24/2022 0009 by Justus Memory, RN Outcome: Not Met (add Reason) 07/23/2022 0005 by Justus Memory, RN Outcome: Progressing   Problem: Health Behavior/Discharge Planning: Goal: Ability to identify and utilize available resources and services will improve 07/27/2022 0010 by Justus Memory, RN Outcome: Adequate for Discharge 07/03/2022 0009 by Justus Memory, RN Outcome: Not Met (add Reason) 06/28/2022 0005 by Justus Memory, RN Outcome:  Progressing Goal: Ability to manage health-related needs will improve 07/26/2022 0010 by Justus Memory, RN Outcome: Adequate for Discharge 07/25/2022 0009 by Justus Memory, RN Outcome: Not Met (add Reason) 06/30/2022 0005 by Justus Memory, RN Outcome: Progressing   Problem: Metabolic: Goal: Ability to maintain appropriate glucose levels will improve 07/17/2022 0010 by Justus Memory, RN Outcome: Adequate for Discharge 07/18/2022 0009 by Justus Memory, RN Outcome: Not Met (add Reason) 07/24/2022 0005 by Justus Memory, RN Outcome: Progressing   Problem: Nutritional: Goal: Maintenance of adequate nutrition will improve 07/09/2022 0010 by Justus Memory, RN Outcome: Adequate for Discharge 07/03/2022 0009 by Justus Memory, RN Outcome: Not Met (add Reason) 07/18/2022 0005 by Justus Memory, RN Outcome: Progressing Goal: Progress toward achieving an optimal weight will improve 07/09/2022 0010 by Justus Memory, RN Outcome: Adequate for Discharge 07/18/2022 0009 by Justus Memory, RN Outcome: Not Met (add Reason) 07/14/2022 0005 by Justus Memory, RN Outcome: Progressing   Problem: Skin Integrity: Goal: Risk for impaired skin integrity will decrease 07/15/2022 0010 by Justus Memory, RN Outcome: Adequate for Discharge 07/09/2022 0009 by Justus Memory, RN Outcome: Not Met (add Reason) 07/25/2022 0005 by Justus Memory, RN Outcome: Progressing   Problem: Tissue Perfusion: Goal: Adequacy of tissue perfusion will improve 07/18/2022 0010 by Justus Memory, RN Outcome: Adequate for Discharge 07/17/2022 0009 by Justus Memory, RN Outcome: Not Met (add Reason) 07/03/2022 0005 by Justus Memory, RN Outcome: Progressing   Problem: Safety: Goal: Non-violent Restraint(s) 07/25/2022 0010 by Justus Memory, RN Outcome: Adequate for Discharge 07/26/2022 0009 by Justus Memory, RN Outcome: Not Met (add Reason) 07/03/2022 0005 by Justus Memory,  RN Outcome: Progressing  Patient coded

## 2022-07-22 LAB — CALCIUM, IONIZED: Calcium, Ionized, Serum: <3 mg/dL — ABNORMAL LOW (ref 4.5–5.6)

## 2022-07-28 NOTE — Plan of Care (Signed)
Plan of care on goals discussed with wife and patient, time given for questions and answers. Patient handbook at bedside.  Problem: Education: Goal: Knowledge of General Education information will improve Description: Including pain rating scale, medication(s)/side effects and non-pharmacologic comfort measures Outcome: Progressing   Problem: Health Behavior/Discharge Planning: Goal: Ability to manage health-related needs will improve Outcome: Progressing   Problem: Clinical Measurements: Goal: Ability to maintain clinical measurements within normal limits will improve Outcome: Progressing Goal: Will remain free from infection Outcome: Progressing Goal: Diagnostic test results will improve Outcome: Progressing Goal: Respiratory complications will improve Outcome: Progressing Goal: Cardiovascular complication will be avoided Outcome: Progressing   Problem: Activity: Goal: Risk for activity intolerance will decrease Outcome: Progressing   Problem: Nutrition: Goal: Adequate nutrition will be maintained Outcome: Progressing   Problem: Coping: Goal: Level of anxiety will decrease Outcome: Progressing   Problem: Elimination: Goal: Will not experience complications related to bowel motility Outcome: Progressing Goal: Will not experience complications related to urinary retention Outcome: Progressing   Problem: Pain Managment: Goal: General experience of comfort will improve Outcome: Progressing   Problem: Safety: Goal: Ability to remain free from injury will improve Outcome: Progressing   Problem: Skin Integrity: Goal: Risk for impaired skin integrity will decrease Outcome: Progressing   Problem: Education: Goal: Ability to describe self-care measures that may prevent or decrease complications (Diabetes Survival Skills Education) will improve Outcome: Progressing Goal: Individualized Educational Video(s) Outcome: Progressing   Problem: Coping: Goal: Ability to  adjust to condition or change in health will improve Outcome: Progressing   Problem: Fluid Volume: Goal: Ability to maintain a balanced intake and output will improve Outcome: Progressing   Problem: Health Behavior/Discharge Planning: Goal: Ability to identify and utilize available resources and services will improve Outcome: Progressing Goal: Ability to manage health-related needs will improve Outcome: Progressing   Problem: Metabolic: Goal: Ability to maintain appropriate glucose levels will improve Outcome: Progressing   Problem: Nutritional: Goal: Maintenance of adequate nutrition will improve Outcome: Progressing Goal: Progress toward achieving an optimal weight will improve Outcome: Progressing   Problem: Skin Integrity: Goal: Risk for impaired skin integrity will decrease Outcome: Progressing   Problem: Tissue Perfusion: Goal: Adequacy of tissue perfusion will improve Outcome: Progressing   Problem: Safety: Goal: Non-violent Restraint(s) Outcome: Progressing

## 2022-07-28 NOTE — Progress Notes (Addendum)
eLink Physician-Brief Progress Note Patient Name: Richard Whitaker DOB: Dec 16, 1945 MRN: [id number]   Date of Service  07/03/2022  HPI/Events of Note  Notified that pt went into caridac arrest.  Per bedside, pt may have had aspirated on tube feeds, then became hypoxemic and went into PEA arrest.  CPR immediately started, and ROSC was obtained after 12 minutes. Pt had gone into Vtach during the code and was defibrillated. PT had also been intubated during the code.   Presently maintaining HR 80s, MAP 70s off pressor support. SpO2 90% on 100% FiO2   eICU Interventions  Will check lactate, CBC, BMP, Mg and see if there are any abnormalities that need to be corrected.  CRRT had been stopped during the code.  Will follow up post code ABG, CXR         M DELA CRUZ 07/01/2022, 8:57 PM  9:48 PM CXR shows ETT in place, low lung volumes, but otherwise no large atelectatic process noted.  ABG shows severe metabolic acidosis.  Ordered 2 amps bicarb to be pushed.  Will continue to follow serial ABG, will make further vent adjustments as warranted.    10:19 PM Pt again became bradycardic and went into cardiac arrest.  ACLS immediately started with pushes of epinephrine given as per AHA guidelines.  After another 12 minutes of resuscitative efforts, pt remains in PEA and CPR was stopped. Family at bedside, aware of events.

## 2022-07-28 NOTE — Progress Notes (Signed)
[facility] Gastroenterology Progress Note  Richard Whitaker 77 y.o. 1945-06-24   Subjective: Opens eyes to his name. Family feels he is responding more to them. Son and pt's wife in room.  Objective: Vital signs: Vitals:   07/26/2022 1200 07/22/2022 1300  BP: 105/65 (!) 109/59  Pulse: (!) 122 98  Resp: (!) 31 (!) 25  Temp: 100.2 F (37.9 C) (!) 100.4 F (38 C)  SpO2: 96% 92%    Physical Exam: Gen: somnolent, ill-appearing, well-nourished, no acute distress  HEENT: anicteric sclera, nasoenteric tube CV: RRR Chest: CTA B Abd: diffuse tenderness to touch and light palpation with guarding, distended, +BS Ext: no edema  Lab Results: Recent Labs    07/19/22 0810 07/19/22 0811 07/19/22 1712 07/23/2022 0601 07/14/2022 0603  NA  --    < > 137  --  135  K  --    < > 3.5  --  3.8  CL  --    < > 102  --  101  CO2  --    < > 22  --  22  GLUCOSE  --    < > 146*  --  148*  BUN  --    < > 37*  --  31*  CREATININE  --    < > 3.27*  --  2.62*  CALCIUM  --    < > 5.9*  --  6.1*  MG 2.0  --   --  2.3  --   PHOS  --    < > 3.0  --  2.7   < > = values in this interval not displayed.   Recent Labs    07/19/22 0810 07/19/22 0811 07/01/2022 0601 07/13/2022 0603  AST 245*  --  138*  --   ALT 173*  --  124*  --   ALKPHOS 57  --  59  --   BILITOT 0.9  --  0.6  --   PROT 5.6*  --  5.5*  --   ALBUMIN 2.6*   < > 2.4* 2.4*   < > = values in this interval not displayed.   Recent Labs    07/19/22 0810 07/18/2022 0601  WBC 13.2* 14.5*  NEUTROABS 10.7* 11.4*  HGB 11.9* 10.4*  HCT 36.2* 32.6*  MCV 78.4* 80.5  PLT 127* 132*      Assessment/Plan: Acute complicated pancreatitis following an EGD at the [place] with acute renal failure on CRRT. Nasoenteric tube for nutrition. Chart reports pt may be transferred to [facility] due to ongoing dialysis needs. Dr. Ewing Schlein will f/u from [facility] tomorrow.   Richard Whitaker 07/04/2022, 2:50 PM  Questions please call [phone] ID: Richard Whitaker,  male   DOB: 10/08/1945, 77 y.o.   MRN: [id number]

## 2022-07-28 NOTE — Progress Notes (Signed)
Nutrition Brief Note:   MD consult for EN management. Full assessment by RD on 6/21. RD placed recommendations from previous RD note.   -Initiate Vital 1.5 @ 20 ml/hr and increase by 10 ml every 4 hours to goal rate of 60 ml/hr.    - Add 60 ml Prosource TF BID     - Tube feeding regimen provides 2320 kcal (100% of needs), 137 grams of protein, and 1100 ml of H2O.    Bethann Humble, RD, LDN, CNSC.

## 2022-07-28 NOTE — ED Provider Notes (Signed)
Department of Emergency Medicine CODE BLUE CONSULTATION    Code Blue CONSULT NOTE  Chief Complaint: Cardiac arrest/unresponsive   Level V Caveat: Unresponsive  History of present illness: I was contacted by the hospital for a CODE BLUE cardiac arrest upstairs and presented to the patient's bedside.    ROS: Unable to obtain, Level V caveat  Scheduled Meds:  Chlorhexidine Gluconate Cloth  6 each Topical Daily   EPINEPHrine       feeding supplement (PROSource TF20)  60 mL Per Tube BID   heparin  5,000 Units Subcutaneous Q8H   insulin aspart  0-6 Units Subcutaneous Q4H   metoprolol tartrate  12.5 mg Oral BID   mouth rinse  15 mL Mouth Rinse 4 times per day   pantoprazole (PROTONIX) IV  40 mg Intravenous Q24H   Continuous Infusions:   prismasol BGK 4/2.5 500 mL/hr at 06/30/2022 1311    prismasol BGK 4/2.5 400 mL/hr at 07/19/2022 1704   sodium chloride 250 mL (07/19/22 2249)   sodium chloride     feeding supplement (VITAL 1.5 CAL) 20 mL/hr at 06/30/2022 2000   norepinephrine (LEVOPHED) Adult infusion 2 mcg/min (07/05/2022 2100)   prismasol BGK 4/2.5 2,000 mL/hr at 07/07/2022 1810   PRN Meds:.Place/Maintain arterial line **AND** sodium chloride, acetaminophen **OR** acetaminophen, albuterol, EPINEPHrine, heparin, HYDROmorphone (DILAUDID) injection, naLOXone (NARCAN)  injection, ondansetron **OR** ondansetron (ZOFRAN) IV, mouth rinse, oxyCODONE, polyethylene glycol Past Medical History:  Diagnosis Date   Arthritis    DM (diabetes mellitus) (HCC)    Hypertension    Prostate cancer (HCC)    Reflux    Past Surgical History:  Procedure Laterality Date   hip replacement  Right 12/14/2017   TRANSURETHRAL RESECTION OF PROSTATE     Social History   Socioeconomic History   Marital status: Married    Spouse name: Not on file   Number of children: Not on file   Years of education: Not on file   Highest education level: Not on file  Occupational History   Not on file  Tobacco Use    Smoking status: Former    Types: Cigarettes    Quit date: 08/23/2003    Years since quitting: 18.9   Smokeless tobacco: Never  Substance and Sexual Activity   Alcohol use: Yes    Comment: occ   Drug use: No   Sexual activity: Not on file  Other Topics Concern   Not on file  Social History Narrative   Not on file   Social Determinants of Health   Financial Resource Strain: Not on file  Food Insecurity: No Food Insecurity (07/18/2022)   Hunger Vital Sign    Worried About Running Out of Food in the Last Year: Never true    Ran Out of Food in the Last Year: Never true  Transportation Needs: No Transportation Needs (07/02/2022)   PRAPARE - Administrator, Civil Service (Medical): No    Lack of Transportation (Non-Medical): No  Physical Activity: Not on file  Stress: Not on file  Social Connections: Not on file  Intimate Partner Violence: Not At Risk (07/08/2022)   Humiliation, Afraid, Rape, and Kick questionnaire    Fear of Current or Ex-Partner: No    Emotionally Abused: No    Physically Abused: No    Sexually Abused: No   Allergies  Allergen Reactions   Penicillins     unknown    Last set of Vital Signs (not current) Vitals:   07/07/2022 2134  07/07/2022 2135  BP:    Pulse:    Resp: 20 20  Temp: 100.2 F (37.9 C) 100.2 F (37.9 C)  SpO2:        Physical Exam Gen: unresponsive Cardiovascular: pulseless  Resp: apneic. Breath sounds equal bilaterally with bagging  Abd: nondistended  Neuro: GCS 3, unresponsive to pain  HEENT: No blood in posterior pharynx, gag reflex absent  Neck: No crepitus  Musculoskeletal: No deformity  Skin: warm  Procedures   Cardiopulmonary Resuscitation (CPR) Procedure Note  Directed/Performed by: Glyn Ade I personally directed ancillary staff and/or performed CPR in an effort to regain return of spontaneous circulation and to maintain cardiac, neuro and systemic perfusion.    Medical Decision making  Was called  back to patient's bedside for second CODE BLUE of the day. Since I last saw the patient, they have been weaning epinephrine drip.  Per bedside nursing, he began to rapidly develop bradycardia and ultimately became pulseless approximately 4 minutes prior to my arrival. Again patient was resuscitated according to ACLS algorithm.  Received epinephrine IV injection x 5 with interval CPR and 2 minutes pulse checks.  Serial pulse checks every 2 minutes demonstrated no return of circulation and PEA on the monitor. H's and T's were reviewed allowed with bedside nursing team. No reversible etiologies were identified. Code was continued for 7 cycles.  At 1016, I informed the family of my concern for lack of ROSC and combined downtime today.  Informed them of unlikely nature of successful resuscitation.  They agreed with discontinuation of resuscitation at 1018. Bedside nursing had to call security to bedside due to distress of bedside family members. Family was informed directly that Mr. Renato Gails was dead.  Time of death 10:18 PM Death summary and death certificate per primary team.   Glyn Ade, MD 07/11/2022 2240

## 2022-07-28 NOTE — Procedures (Signed)
22Arterial Catheter Insertion Procedure Note  Richard Whitaker  [id number]  Apr 29, 1945  Date:07/05/2022  Time:10:22 PM    Provider Performing: Hassan Buckler    Procedure: Insertion of Arterial Line (35573) without US guidance  Indication(s) Blood pressure monitoring and/or need for frequent ABGs  Consent Unable to obtain consent due to emergent nature of procedure.  Anesthesia None   Time Out Verified patient identification, verified procedure, site/side was marked, verified correct patient position, special equipment/implants available, medications/allergies/relevant history reviewed, required imaging and test results available.   Sterile Technique Maximal sterile technique including full sterile barrier drape, hand hygiene, sterile gown, sterile gloves, mask, hair covering, sterile ultrasound probe cover (if used).   Procedure Description Area of catheter insertion was cleaned with chlorhexidine and draped in sterile fashion. Without real-time ultrasound guidance an arterial catheter was placed into the right radial artery.  Appropriate arterial tracings confirmed on monitor.     Complications/Tolerance None; patient tolerated the procedure well.   EBL Minimal   Specimen(s) None

## 2022-07-28 NOTE — Progress Notes (Signed)
NAME:  Richard Whitaker, MRN:  [id number], DOB:  09-26-1945, LOS: 4 ADMISSION DATE:  07/10/2022, CONSULTATION DATE:  07/18/22 REFERRING MD:  Frederick Peers - [facility] , CHIEF COMPLAINT:  abd pain   History of Present Illness:  [name] PMH DM2 HTN who presented to [facility] ED 6/19 with abd pain and vomiting. On 6/18 pt underwent EGD with duodenal polyp bx and cautery. Workup in ED revealed elevated Lipase and LFTs, as well as CT findings concerning for acute pancreatitis.  Admitted to [facility] and GI consulted. On 6/20 he had worse renal failure, associated hyperkalemia and acidosis. Nephro consulted. 6/21 worse renal fxn, mentation, and minimal UOP -- plan for CRRT initiation   PCCM consulted in this setting   Pertinent  Medical History  Duodenal polyp HTN DM2  Prior tobacco use Hx prostate Ca, hodgkins dz  HLD Anemia  Significant Hospital Events: Including procedures, antibiotic start and stop dates in addition to other pertinent events   6/18 EGD with duodenal polyp biopsy + incomplete resection at [facility]. Started vomiting at home after  6/19 [facility] admit for acute pancreatitis. GI consult  6/20 worse renal fxn + lyte abnormalities and acidosis. Nephro consult   6/21 worse mentation, renal fxn. Lytes ok acidosis improving on bicarb gtt. PCCM consult for CRRT, CRRT starts  Interim History / Subjective:  Tolerating CRRT, intermittently febrile  Objective   Blood pressure 134/77, pulse 96, temperature 99.3 F (37.4 C), resp. rate 18, height 6\' 1"  (1.854 m), weight 100 kg, SpO2 99 %.        Intake/Output Summary (Last 24 hours) at 07/18/2022 8469 Last data filed at 06/28/2022 0800 Gross per 24 hour  Intake 1499.34 ml  Output 2152.5 ml  Net -653.16 ml    Filed Weights   07/02/2022 0558 07/19/22 0500 06/30/2022 0514  Weight: 90.7 kg 99.7 kg 100 kg    Examination: General: wdwn ill appearing M NAD  HENT: NCAT Anicteric sclera Lungs: CTAb deep respirations  Cardiovascular: tachycardic cap refill < 3 sec   Abdomen: round slightly tense  Extremities: no acute joint deformity  Neuro: Eyes closed but easily arousable GU: foley with minimal dark urine   Resolved Hospital Problem list     Assessment & Plan:   Severe acute pancreatitis: Personally postprocedural with biopsies near presumably the ampulla of Vader.  Procedure performed at [facility] -- Pain control with IV Dilaudid -- NG trickle tube feeds, advance as tolerated now   Acute renal failure: Presumed ATN in the setting of third spacing, hypovolemia related to pancreatitis.  Essentially an uric. -- appreciate nephrology assistance -- Continue CRRT, attempt to pull fluid okay to bolus of albumin if needed, intermittent pressor support if needed --Plan to stop CRRT soon, at next filter change if necessary, IHD per nephrology recommendation suspect will need to transfer to [facility]   Acute metabolic encephalopathy: Likely multifactorial in the setting of renal failure, pain, delirium -- Supportive care, trying to treat reversible causes   Hypocalcemia: Sign of severe pancreatitis, saponification, slightly improved -- Continue replacement   Diabetes: Sliding scale insulin  Aflutter/AFIB: Rates 100s-110s. --Metoprolol PO BID, increase as needed --Currently sinus tach - defer AC given likely provoked Afib with pancreatitis   Hypertension: Exacerbated by pain, being cautious with resuming antihypertensives as to not drop blood pressure too low in effort to maximize perfusion to the kidneys --metop per tube as above  Fever:   Best Practice (right click and "Reselect all SmartList Selections"  daily)   Diet/type: clear liquids encourage PO increase TF DVT prophylaxis: prophylactic heparin  GI prophylaxis: PPI Lines: Dialysis Catheter Foley:  Yes, and it is still needed Code Status:  full code Last date of multidisciplinary goals of care discussion [--]  Labs   CBC: Recent Labs  Lab 07/27/2022 0624 07/17/22 0302  07/18/22 0304 07/19/22 0810 06/30/2022 0601  WBC 13.0* 20.9* 12.3* 13.2* 14.5*  NEUTROABS 11.0*  --  9.8* 10.7* 11.4*  HGB 16.6 15.9 12.5* 11.9* 10.4*  HCT 52.8* 52.6* 39.2 36.2* 32.6*  MCV 81.5 85.3 81.7 78.4* 80.5  PLT 273 201 167 127* 132*     Basic Metabolic Panel: Recent Labs  Lab 07/17/22 1012 07/17/22 1301 07/18/22 0304 07/18/22 0305 07/18/22 1729 07/19/22 0034 07/19/22 0810 07/19/22 0811 07/19/22 1712 07/01/2022 0601 06/30/2022 0603  NA  --    < >  --    < > 141 141  --  138 137  --  135  K  --    < >  --    < > 3.6 3.9  --  3.6 3.5  --  3.8  CL  --    < >  --    < > 104 103  --  103 102  --  101  CO2  --    < >  --    < > 22 23  --  19* 22  --  22  GLUCOSE  --    < >  --    < > 146* 140*  --  137* 146*  --  148*  BUN  --    < >  --    < > 62* 53*  --  42* 37*  --  31*  CREATININE  --    < >  --    < > 5.43* 4.42*  --  3.65* 3.27*  --  2.62*  CALCIUM  --    < >  --    < > 4.8* 6.0*  --  5.9* 5.9*  --  6.1*  MG 1.2*  --  1.8  --   --  2.1 2.0  --   --  2.3  --   PHOS  --    < >  --    < > 4.1 3.9  --  3.4 3.0  --  2.7   < > = values in this interval not displayed.    GFR: Estimated Creatinine Clearance: 30.8 mL/min (A) (by C-G formula based on SCr of 2.62 mg/dL (H)). Recent Labs  Lab 07/17/22 0302 07/17/22 0754 07/17/22 1644 07/17/22 2116 07/18/22 0150 07/18/22 0304 07/18/22 0453 07/19/22 0810 07/23/2022 0601  WBC 20.9*  --   --   --   --  12.3*  --  13.2* 14.5*  LATICACIDVEN  --    < > 6.6* 4.7* 2.9*  --  2.7*  --   --    < > = values in this interval not displayed.     Liver Function Tests: Recent Labs  Lab 07/17/22 2116 07/18/22 0304 07/18/22 0305 07/18/22 0944 07/18/22 1729 07/19/22 0810 07/19/22 0811 07/19/22 1712 07/15/2022 0601 07/05/2022 0603  AST 647* 589*  --  460*  --  245*  --   --  138*  --   ALT 278* 253*  --  220*  --  173*  --   --  124*  --   ALKPHOS 50 48  --  51  --  57  --   --  59  --   BILITOT 0.6 0.7  --  0.6  --  0.9  --    --  0.6  --   PROT 5.2* 5.1*  --  5.0*  --  5.6*  --   --  5.5*  --   ALBUMIN 2.6* 2.5*   < > 2.5*   < > 2.6* 2.7* 2.4* 2.4* 2.4*   < > = values in this interval not displayed.    Recent Labs  Lab 2022-08-01 0643 07/17/22 1012 07/18/22 0304  LIPASE 1,765* 982* 393*    Recent Labs  Lab 07/17/22 1301 07/18/22 0150  AMMONIA 40* 25     ABG    Component Value Date/Time   HCO3 18.0 (L) 07/17/2022 2306   TCO2 27 08/22/2013 1129   ACIDBASEDEF 9.9 (H) 07/17/2022 2306   O2SAT 37.5 07/17/2022 2306     Coagulation Profile: No results for input(s): "INR", "PROTIME" in the last 168 hours.  Cardiac Enzymes: No results for input(s): "CKTOTAL", "CKMB", "CKMBINDEX", "TROPONINI" in the last 168 hours.  HbA1C: Hgb A1c MFr Bld  Date/Time Value Ref Range Status  08/01/2022 06:29 AM 5.9 (H) 4.8 - 5.6 % Final    Comment:    (NOTE) Pre diabetes:          5.7%-6.4%  Diabetes:              >6.4%  Glycemic control for   <7.0% adults with diabetes   08/22/2013 01:35 PM 11.2 (H) <5.7 % Final    Comment:    (NOTE)                                                                       According to the ADA Clinical Practice Recommendations for 2011, when HbA1c is used as a screening test:  >=6.5%   Diagnostic of Diabetes Mellitus           (if abnormal result is confirmed) 5.7-6.4%   Increased risk of developing Diabetes Mellitus References:Diagnosis and Classification of Diabetes Mellitus,Diabetes Care,2011,34(Suppl 1):S62-S69 and Standards of Medical Care in         Diabetes - 2011,Diabetes Care,2011,34 (Suppl 1):S11-S61.    CBG: Recent Labs  Lab 07/19/22 2217 07/19/22 2355 07/17/2022 0349 07/12/2022 0403 07/17/2022 0806  GLUCAP 145* 123* 135* 132* 145*     Review of Systems:   Unable due to encephalopathy   From chart review + n/v/abdominal pain   Past Medical History:  He,  has a past medical history of Arthritis, DM (diabetes mellitus) (HCC), Hypertension, Prostate cancer  (HCC), and Reflux.   Surgical History:   Past Surgical History:  Procedure Laterality Date   hip replacement  Right 12/14/2017   TRANSURETHRAL RESECTION OF PROSTATE       Social History:   reports that he quit smoking about 18 years ago. His smoking use included cigarettes. He has never used smokeless tobacco. He reports current alcohol use. He reports that he does not use drugs.   Family History:  His family history is not on file.   Allergies Allergies  Allergen Reactions   Penicillins     unknown     Home Medications  Prior to Admission  medications   Medication Sig Start Date End Date Taking? Authorizing Provider  aspirin EC 81 MG tablet Take 81 mg by mouth daily. Swallow whole.   Yes [provider]  atorvastatin (LIPITOR) 40 MG tablet Take 40 mg by mouth daily.   Yes [provider]  ferrous sulfate 325 (65 FE) MG EC tablet Take 325 mg by mouth daily with breakfast.   Yes [provider]  glipiZIDE (GLUCOTROL) 10 MG tablet Take 10 mg by mouth daily before breakfast.   Yes [provider]  metFORMIN (GLUCOPHAGE) 1000 MG tablet Take 1,000 mg by mouth 2 (two) times daily with a meal.   Yes [provider]  metoprolol tartrate (LOPRESSOR) 50 MG tablet Take 25 mg by mouth 2 (two) times daily.   Yes [provider]  Multiple Vitamin (MULTIVITAMIN WITH MINERALS) TABS tablet Take 1 tablet by mouth daily.   Yes [provider]  pantoprazole (PROTONIX) 40 MG tablet Take 40 mg by mouth 2 (two) times daily.   Yes [provider]  triamterene-hydrochlorothiazide (MAXZIDE) 75-50 MG per tablet Take 0.5 tablets by mouth daily.   Yes [provider]  metFORMIN (GLUCOPHAGE) 500 MG tablet Take 1 tablet (500 mg total) by mouth 2 (two) times daily with a meal. Patient not taking: Reported on 12/18/2017 08/22/13   Gilda Crease, MD     Critical care time:    CRITICAL CARE Performed by: Karren Burly   Total critical care time: 32 minutes  Critical care time was exclusive of separately billable procedures and treating other patients. Critical care was necessary to treat or prevent imminent or life-threatening deterioration.  Critical care was time spent personally by me on the following activities: development of treatment plan with patient and/or surrogate as well as nursing, discussions with consultants, evaluation of patient's response to treatment, examination of patient, obtaining history from patient or surrogate, ordering and performing treatments and interventions, ordering and review of laboratory studies, ordering and review of radiographic studies, pulse oximetry and re-evaluation of patient's condition.  Karren Burly, MD [facility] Pulmonary/Critical Care Medicine Amion for contact info 07/08/2022, 9:18 AM

## 2022-07-28 NOTE — Progress Notes (Signed)
[facility] Progress Note   Assessment/ Plan:   Assessment/Recommendations: Richard Whitaker is a 77 y.o. male with a past medical history HTN, DM2, and GERD who present w/ severe acute pancreatitis c/b severe oliguric aki    Severe oliguric AKI: Likely secondary to ATN associated with severe acute pancreatitis.  Urinalysis only with mild proteinuria - very little UOP and Cr continues to rise - sig electrolyte abnormalities - needs dialytic intervention- discussed with pt, wife, and son at bedside - started CRRT 6/21- anticipate another 24 hrs and then will see if needs to go to [facility] for [facility]HD-- > no restart if clots today.  Start pulling a little fluid- net neg 50-100 mL/ hr   Hyperkalemia:  - resolved with CRRT   NAGMA/AGMA:  - resolved with CRRT   Severe acute pancreatitis: Associated with recent EGD.    Hypocalcemia: Associated with pancreatitis.   - continue aggressive repletion   Elevated troponin: Likely related to demand.  Management per primary team.  Cardiology following, TTE 6/22, no WMA, EF hyperdynamic   Acute metabolic encephalopathy:improving  Subjective:    On CRRT, doing well.  Son at bedside, updated.  No big issues today.  Ca still needs to be aggressively supplemented.   Objective:   BP 134/77 (BP Location: Right Arm)   Pulse 96   Temp 99.3 F (37.4 C)   Resp 18   Ht 6\' 1"  (1.854 m)   Wt 100 kg   SpO2 99%   BMI 29.09 kg/m   Intake/Output Summary (Last 24 hours) at 07/19/2022 0454 Last data filed at 07/12/2022 0800 Gross per 24 hour  Intake 1664.44 ml  Output 2242.5 ml  Net -578.06 ml   Weight change: 0.3 kg  Physical Exam: Gen:NAD, ill-appearing CVS: tachycardic Resp: RRR Abd: very tender in epigastric Ext: no LE edema  Imaging: ECHOCARDIOGRAM COMPLETE  Result Date: 07/19/2022    ECHOCARDIOGRAM REPORT   Patient Name:   Richard Whitaker Date of Exam: 07/18/2022 Medical Rec #:  [id number]         Height:       73.0 in  Accession #:    [id number]        Weight:       200.0 lb Date of Birth:  [birth year]         BSA:          2.152 m Patient Age:    76 years          BP:           173/90 mmHg Patient Gender: M                 HR:           99 bpm. Exam Location:  Inpatient Procedure: 2D Echo, Cardiac Doppler, Color Doppler and Intracardiac            Opacification Agent Indications:     Elevated troponins  History:         Patient has no prior history of Echocardiogram examinations.                  Signs/Symptoms:Syncope; Risk Factors:Diabetes and Hypertension.                  Prostate CA.  Sonographer:     Milda Smart Referring Phys:  [id number] DAVID GIRGUIS Diagnosing Phys: Weston Brass MD  Sonographer Comments: Suboptimal apical window. Image acquisition challenging due to patient body  habitus and Image acquisition challenging due to respiratory motion. Subcostal window not visible. Pt in severe abdominal pain and cannot tolerate pressure from probe. IMPRESSIONS  1. Left ventricular ejection fraction, by estimation, is >75%. The left ventricle has hyperdynamic function. The left ventricle has no regional wall motion abnormalities. There is mild left ventricular hypertrophy. Left ventricular diastolic parameters are consistent with Grade I diastolic dysfunction (impaired relaxation).  2. Midcavitary/LVOT obstruction with max instantaneous gradient 38 mmHg (vmax 3 m/s).  3. Right ventricular systolic function is normal. The right ventricular size is normal. Tricuspid regurgitation signal is inadequate for assessing PA pressure.  4. The mitral valve is grossly normal. Trivial mitral valve regurgitation. No evidence of mitral stenosis.  5. The aortic valve is tricuspid. There is mild calcification of the aortic valve. Aortic valve regurgitation is not visualized. No aortic stenosis is present. FINDINGS  Left Ventricle: Left ventricular ejection fraction, by estimation, is >75%. The left ventricle has hyperdynamic function. The  left ventricle has no regional wall motion abnormalities. Definity contrast agent was given IV to delineate the left ventricular endocardial borders. The left ventricular internal cavity size was normal in size. There is mild left ventricular hypertrophy. Left ventricular diastolic parameters are consistent with Grade I diastolic dysfunction (impaired relaxation). Right Ventricle: The right ventricular size is normal. No increase in right ventricular wall thickness. Right ventricular systolic function is normal. Tricuspid regurgitation signal is inadequate for assessing PA pressure. Left Atrium: Left atrial size was normal in size. Right Atrium: Right atrial size was normal in size. Pericardium: Trivial pericardial effusion is present. Presence of epicardial fat layer. Mitral Valve: The mitral valve is grossly normal. Trivial mitral valve regurgitation. No evidence of mitral valve stenosis. Tricuspid Valve: The tricuspid valve is normal in structure. Tricuspid valve regurgitation is trivial. No evidence of tricuspid stenosis. Aortic Valve: The aortic valve is tricuspid. There is mild calcification of the aortic valve. Aortic valve regurgitation is not visualized. No aortic stenosis is present. Pulmonic Valve: The pulmonic valve was normal in structure. Pulmonic valve regurgitation is trivial. No evidence of pulmonic stenosis. Aorta: The aortic root is normal in size and structure. Venous: The inferior vena cava was not well visualized. IAS/Shunts: The interatrial septum was not well visualized.  LEFT VENTRICLE PLAX 2D LVIDd:         3.50 cm   Diastology LVIDs:         2.40 cm   LV e' medial:    5.00 cm/s LV PW:         1.10 cm   LV E/e' medial:  12.2 LV IVS:        1.34 cm   LV e' lateral:   4.79 cm/s LVOT diam:     2.10 cm   LV E/e' lateral: 12.7 LVOT Area:     3.46 cm  RIGHT VENTRICLE RV Basal diam:  3.60 cm RV S prime:     23.90 cm/s TAPSE (M-mode): 1.9 cm LEFT ATRIUM             Index        RIGHT ATRIUM           Index LA diam:        3.30 cm 1.53 cm/m   RA Area:     7.09 cm LA Vol (A2C):   34.3 ml 15.94 ml/m  RA Volume:   8.60 ml  4.00 ml/m LA Vol (A4C):   36.9 ml 17.15 ml/m LA Biplane Vol: 37.1 ml  17.24 ml/m   AORTA Ao Root diam: 3.50 cm Ao Asc diam:  3.50 cm MITRAL VALVE               TRICUSPID VALVE MV Area (PHT): 3.99 cm    TR Peak grad:   4.8 mmHg MV Decel Time: 190 msec    TR Vmax:        109.00 cm/s MV E velocity: 60.80 cm/s MV A velocity: 97.30 cm/s  SHUNTS MV E/A ratio:  0.62        Systemic Diam: 2.10 cm Weston Brass MD Electronically signed by Weston Brass MD Signature Date/Time: 07/19/2022/5:15:13 AM    Final (Updated)    DG Abd 1 View  Result Date: 07/18/2022 CLINICAL DATA:  Placement of feeding tube EXAM: ABDOMEN - 1 VIEW COMPARISON:  07/09/2022 FINDINGS: Tip of feeding tube is seen in the region of body of stomach. Bowel gas pattern is nonspecific. Catheter is seen in the course of right iliac vessels. IMPRESSION: Tip of feeding tube is seen in the region of body of stomach. Nonspecific bowel gas pattern. Electronically Signed   By: Ernie Avena M.D.   On: 07/18/2022 17:13   DG CHEST PORT 1 VIEW  Result Date: 07/18/2022 CLINICAL DATA:  Fevers EXAM: PORTABLE CHEST 1 VIEW COMPARISON:  07/17/2022 FINDINGS: Cardiac shadow is stable. Overall inspiratory effort is poor. Left retrocardiac consolidation is noted with associated small effusion. No bony abnormality is noted. IMPRESSION: Increasing left retrocardiac consolidation with small effusion. Electronically Signed   By: Alcide Clever M.D.   On: 07/18/2022 10:01    Labs: BMET Recent Labs  Lab 07/17/22 2116 07/18/22 0305 07/18/22 0944 07/18/22 1729 07/19/22 0034 07/19/22 0811 07/19/22 1712 07/05/2022 0603  NA 145 141 140 141 141 138 137 135  K 4.7 4.3 3.9 3.6 3.9 3.6 3.5 3.8  CL 115* 110 108 104 103 103 102 101  CO2 15* 16* 19* 22 23 19* 22 22  GLUCOSE 140* 146* 137* 146* 140* 137* 146* 148*  BUN 48* 52* 57* 62* 53* 42*  37* 31*  CREATININE 4.54* 4.84* 5.14* 5.43* 4.42* 3.65* 3.27* 2.62*  CALCIUM 5.1* 4.9* 4.8* 4.8* 6.0* 5.9* 5.9* 6.1*  PHOS 2.9 3.3  --  4.1 3.9 3.4 3.0 2.7   CBC Recent Labs  Lab 07/05/2022 0624 07/17/22 0302 07/18/22 0304 07/19/22 0810 07/07/2022 0601  WBC 13.0* 20.9* 12.3* 13.2* 14.5*  NEUTROABS 11.0*  --  9.8* 10.7* 11.4*  HGB 16.6 15.9 12.5* 11.9* 10.4*  HCT 52.8* 52.6* 39.2 36.2* 32.6*  MCV 81.5 85.3 81.7 78.4* 80.5  PLT 273 201 167 127* 132*    Medications:     Chlorhexidine Gluconate Cloth  6 each Topical Daily   heparin  5,000 Units Subcutaneous Q8H   insulin aspart  0-6 Units Subcutaneous Q4H   metoprolol tartrate  12.5 mg Oral BID   mouth rinse  15 mL Mouth Rinse 4 times per day   pantoprazole (PROTONIX) IV  40 mg Intravenous Q24H    Bufford Buttner MD 07/19/2022, 8:39 AM

## 2022-07-28 NOTE — ED Provider Notes (Signed)
Department of Emergency Medicine CODE BLUE CONSULTATION    Code Blue CONSULT NOTE  Chief Complaint: Cardiac arrest/unresponsive   Level V Caveat: Unresponsive  History of present illness: I was contacted by the hospital for a CODE BLUE cardiac arrest upstairs and presented to the patient's bedside.    ROS: Unable to obtain, Level V caveat  Scheduled Meds:  Chlorhexidine Gluconate Cloth  6 each Topical Daily   EPINEPHrine NaCl       feeding supplement (PROSource TF20)  60 mL Per Tube BID   heparin  5,000 Units Subcutaneous Q8H   insulin aspart  0-6 Units Subcutaneous Q4H   metoprolol tartrate  12.5 mg Oral BID   mouth rinse  15 mL Mouth Rinse 4 times per day   pantoprazole (PROTONIX) IV  40 mg Intravenous Q24H   Continuous Infusions:   prismasol BGK 4/2.5 500 mL/hr at 07/14/2022 1311    prismasol BGK 4/2.5 400 mL/hr at 07/12/2022 1704   sodium chloride 250 mL (07/19/22 2249)   feeding supplement (VITAL 1.5 CAL) 20 mL/hr at 07/10/2022 2000   norepinephrine (LEVOPHED) Adult infusion 2 mcg/min (07/19/2022 2017)   prismasol BGK 4/2.5 2,000 mL/hr at 07/24/2022 1810   PRN Meds:.acetaminophen **OR** acetaminophen, albuterol, EPINEPHrine NaCl, heparin, HYDROmorphone (DILAUDID) injection, naLOXone (NARCAN)  injection, ondansetron **OR** ondansetron (ZOFRAN) IV, mouth rinse, oxyCODONE, polyethylene glycol Past Medical History:  Diagnosis Date   Arthritis    DM (diabetes mellitus) (HCC)    Hypertension    Prostate cancer (HCC)    Reflux    Past Surgical History:  Procedure Laterality Date   hip replacement  Right 12/14/2017   TRANSURETHRAL RESECTION OF PROSTATE     Social History   Socioeconomic History   Marital status: Married    Spouse name: Not on file   Number of children: Not on file   Years of education: Not on file   Highest education level: Not on file  Occupational History   Not on file  Tobacco Use   Smoking status: Former    Types: Cigarettes    Quit date:  08/23/2003    Years since quitting: 18.9   Smokeless tobacco: Never  Substance and Sexual Activity   Alcohol use: Yes    Comment: occ   Drug use: No   Sexual activity: Not on file  Other Topics Concern   Not on file  Social History Narrative   Not on file   Social Determinants of Health   Financial Resource Strain: Not on file  Food Insecurity: No Food Insecurity (07/23/2022)   Hunger Vital Sign    Worried About Running Out of Food in the Last Year: Never true    Ran Out of Food in the Last Year: Never true  Transportation Needs: No Transportation Needs (07/13/2022)   PRAPARE - Administrator, Civil Service (Medical): No    Lack of Transportation (Non-Medical): No  Physical Activity: Not on file  Stress: Not on file  Social Connections: Not on file  Intimate Partner Violence: Not At Risk (07/09/2022)   Humiliation, Afraid, Rape, and Kick questionnaire    Fear of Current or Ex-Partner: No    Emotionally Abused: No    Physically Abused: No    Sexually Abused: No   Allergies  Allergen Reactions   Penicillins     unknown    Last set of Vital Signs (not current) Vitals:   07/17/2022 1930 07/12/2022 2000  BP: 94/64 (!) 99/58  Pulse:  Marland Kitchen)  120  Resp: (!) 26 (!) 31  Temp: (!) 100.8 F (38.2 C) (!) 100.8 F (38.2 C)  SpO2:  99%      Physical Exam  Gen: unresponsive Cardiovascular: pulseless  Resp: apneic. Breath sounds equal bilaterally with bagging  Abd: nondistended  Neuro: GCS 3, unresponsive to pain  HEENT: No blood in posterior pharynx, gag reflex absent  Neck: No crepitus  Musculoskeletal: No deformity  Skin: warm  Procedures  INTUBATION Performed by: Glyn Ade Required items: required blood products, implants, devices, and special equipment available Patient identity confirmed: provided demographic data and hospital-assigned identification number Time out: Immediately prior to procedure a "time out" was called to verify the correct patient,  procedure, equipment, support staff and site/side marked as required. Indications: Failure Intubation method: Video Preoxygenation: BVM Sedatives: NA Paralytic: NA Tube Size: 7.5 cuffed Post-procedure assessment: chest rise and ETCO2 monitor Breath sounds: equal and absent over the epigastrium Tube secured by Respiratory Therapy Patient tolerated the procedure well with no immediate complications.  CRITICAL CARE Performed by: Glyn Ade Total critical care time: 45 Critical care time was exclusive of separately billable procedures and treating other patients. Critical care was necessary to treat or prevent imminent or life-threatening deterioration. Critical care was time spent personally by me on the following activities: development of treatment plan with patient and/or surrogate as well as nursing, discussions with consultants, evaluation of patient's response to treatment, examination of patient, obtaining history from patient or surrogate, ordering and performing treatments and interventions, ordering and review of laboratory studies, ordering and review of radiographic studies, pulse oximetry and re-evaluation of patient's condition.  Cardiopulmonary Resuscitation (CPR) Procedure Note Directed/Performed by: Glyn Ade I personally directed ancillary staff and/or performed CPR in an effort to regain return of spontaneous circulation and to maintain cardiac, neuro and systemic perfusion.    Medical Decision making  Mr. Renato Gails was pulseless and apneic on my arrival. Undergoing CPR.  Immediately my arrival I took over ACLS resuscitation.  Patient was 8 minutes pulseless on my arrival status post 1 round of epinephrine.  2 pulse checks with PEA.  Immediately called for pulse check and change of compressor.  Pulse check initially demonstrated PEA epinephrine was administered and CPR was reinitiated. After 2 minutes: Pulse check was called and ventricular tachycardia was  appreciated on the monitor.  CPR was restarted while the defibrillator was charged.  Once discharged, 200 J defibrillation dose desynchronized cardioversion was administered.  CPR was immediately reinitiated and 300 mg of amiodarone were administered.  At the following pulse check, pulses were appreciated and patient was in normal sinus rhythm.  Patient hypotensive, epinephrine infusion was administered and intubation was performed as above. Patient did not gag during the intubation.  Patient successfully achieved ROSC, ventilator circuit demonstrates patient attempting to initiate breaths.  Pulse ox returned to 92%.  Underlying etiologies were discussed, per nursing, patient was eating dinner when he collapsed.  He vomited profusely.  Uncertain if aspiration event, fever or respiratory arrest given desats per bedside nursing.  Chest x-ray, ABG pending and care of patient returned to primary care team for ongoing care and management.   Glyn Ade, MD 07/09/2022 2100

## 2022-07-28 NOTE — Code Documentation (Addendum)
Patient coughing attempting to cough up something, this nurse and NT at bedside trying to suction patient, tube feeds turned off, after a few attempts patient began vomiting a large amount of fluid/bile like stomach content then went unresponsive, E=Link and code blue called at 2022, compression started at 2023 restraints removed by 3rd nurse,rapid response and ED doctor at bedside, PATIENT SHOCKED EPI GIVEN AND ROSC OBTAINED, patient intubated code ended at 2032 2nd code called at 2204 several rounds of CPR and epi given, at 2218  the MD called the code and time of death was recorded.Family was outside patients room and aware of outcome.

## 2022-07-28 DEATH — deceased

## 2022-09-28 NOTE — Death Summary Note (Signed)
  DEATH SUMMARY   Patient Details  Name: Richard Whitaker MRN: [id number] DOB: 07/18/1945  Admission/Discharge Information   Admit Date:  07/23/22  Date of Death: Date of Death: 2022/07/27  Time of Death: Time of Death: 09-20-16  Length of Stay: 5  Referring Physician: [facility], [facility]   Reason(s) for Hospitalization  Post EGD acute pancreatitis Acute renal failure secondary to third spacing from pancreatitis In hospital cardiac arrest thought secondary to aspiration Distributive shock related to pancreatitis Atrial flutter reactive to pancreatitis Hx DM, HTN  Brief Hospital Course (including significant findings, care, treatment, and services provided and events leading to death)  [name] PMH DM2 HTN who presented to [facility] ED 07/23/22 with abd pain and vomiting. On 6/18 pt underwent EGD with duodenal polyp bx and cautery. Workup in ED revealed elevated Lipase and LFTs, as well as CT findings concerning for acute pancreatitis.  Admitted to [facility] and GI consulted. On 6/20 he had worse renal failure, associated hyperkalemia and acidosis. Nephro consulted. 6/21 worse renal fxn, mentation, and minimal UOP -- plan for CRRT initiation   PCCM consulted in this setting   6/18 EGD with duodenal polyp biopsy + incomplete resection at [facility]. Started vomiting at home after  07-23-22 [facility] admit for acute pancreatitis. GI consult  6/20 worse renal fxn + lyte abnormalities and acidosis. Nephro consult   6/21 worse mentation, renal fxn. Lytes ok acidosis improving on bicarb gtt. PCCM consult for CRRT  6/22 started to tolerate trickle feeds Unfortunately during evening of 27-Jul-2022 had massive aspiration event and went into cardiac arrest.  He was intubated and received multiple rounds of high quality CPR but unfortunately could not be revived.   Pertinent Labs and Studies  Significant Diagnostic Studies No results found.  Microbiology No results found for this or any previous visit (from the past 240  hour(s)).  Lab Basic Metabolic Panel: No results for input(s): "NA", "K", "CL", "CO2", "GLUCOSE", "BUN", "CREATININE", "CALCIUM", "MG", "PHOS" in the last 168 hours. Liver Function Tests: No results for input(s): "AST", "ALT", "ALKPHOS", "BILITOT", "PROT", "ALBUMIN" in the last 168 hours. No results for input(s): "LIPASE", "AMYLASE" in the last 168 hours. No results for input(s): "AMMONIA" in the last 168 hours. CBC: No results for input(s): "WBC", "NEUTROABS", "HGB", "HCT", "MCV", "PLT" in the last 168 hours. Cardiac Enzymes: No results for input(s): "CKTOTAL", "CKMB", "CKMBINDEX", "TROPONINI" in the last 168 hours. Sepsis Labs: No results for input(s): "PROCALCITON", "WBC", "LATICACIDVEN" in the last 168 hours.   Lorin Glass 09/10/2022, 9:59 AM
